# Patient Record
Sex: Male | Born: 1950 | Race: Black or African American | Hispanic: No | Marital: Single | Smoking: Current every day smoker
Health system: Southern US, Community
[De-identification: ages and names within clinical notes are randomized; demographics above are authoritative.]

## PROBLEM LIST (undated history)

## (undated) DIAGNOSIS — L309 Dermatitis, unspecified: Secondary | ICD-10-CM

## (undated) HISTORY — PX: NO PAST SURGERIES: SHX2092

---

## 2010-09-27 ENCOUNTER — Emergency Department (HOSPITAL_COMMUNITY): Payer: Self-pay

## 2010-09-27 ENCOUNTER — Emergency Department (HOSPITAL_COMMUNITY)
Admission: EM | Admit: 2010-09-27 | Discharge: 2010-09-27 | Disposition: A | Discharge status: Home / Self Care | Payer: Self-pay | Attending: Emergency Medicine | Admitting: Emergency Medicine

## 2010-09-27 DIAGNOSIS — M545 Low back pain, unspecified: Secondary | ICD-10-CM | POA: Insufficient documentation

## 2010-09-27 DIAGNOSIS — W11XXXA Fall on and from ladder, initial encounter: Secondary | ICD-10-CM | POA: Insufficient documentation

## 2010-09-27 DIAGNOSIS — S82899B Other fracture of unspecified lower leg, initial encounter for open fracture type I or II: Secondary | ICD-10-CM | POA: Insufficient documentation

## 2010-09-27 DIAGNOSIS — M542 Cervicalgia: Secondary | ICD-10-CM | POA: Insufficient documentation

## 2010-09-27 DIAGNOSIS — M79609 Pain in unspecified limb: Secondary | ICD-10-CM | POA: Insufficient documentation

## 2010-09-27 DIAGNOSIS — F17200 Nicotine dependence, unspecified, uncomplicated: Secondary | ICD-10-CM | POA: Insufficient documentation

## 2010-09-27 DIAGNOSIS — M47817 Spondylosis without myelopathy or radiculopathy, lumbosacral region: Secondary | ICD-10-CM | POA: Insufficient documentation

## 2012-07-12 ENCOUNTER — Encounter (HOSPITAL_COMMUNITY): Payer: Self-pay | Admitting: *Deleted

## 2012-07-12 ENCOUNTER — Emergency Department (HOSPITAL_COMMUNITY)
Admission: EM | Admit: 2012-07-12 | Discharge: 2012-07-12 | Disposition: A | Discharge status: Home / Self Care | Payer: Self-pay | Attending: Emergency Medicine | Admitting: Emergency Medicine

## 2012-07-12 DIAGNOSIS — L0291 Cutaneous abscess, unspecified: Secondary | ICD-10-CM

## 2012-07-12 DIAGNOSIS — L02818 Cutaneous abscess of other sites: Secondary | ICD-10-CM | POA: Insufficient documentation

## 2012-07-12 DIAGNOSIS — F17200 Nicotine dependence, unspecified, uncomplicated: Secondary | ICD-10-CM | POA: Insufficient documentation

## 2012-07-12 DIAGNOSIS — H01003 Unspecified blepharitis right eye, unspecified eyelid: Secondary | ICD-10-CM

## 2012-07-12 DIAGNOSIS — H01009 Unspecified blepharitis unspecified eye, unspecified eyelid: Secondary | ICD-10-CM | POA: Insufficient documentation

## 2012-07-12 DIAGNOSIS — L03818 Cellulitis of other sites: Secondary | ICD-10-CM | POA: Insufficient documentation

## 2012-07-12 MED ORDER — ERYTHROMYCIN 5 MG/GM OP OINT: TOPICAL_OINTMENT | OPHTHALMIC | Status: DC

## 2012-07-12 MED ORDER — CEPHALEXIN 500 MG PO CAPS: 500.0000 mg | ORAL_CAPSULE | Freq: Four times a day (QID) | ORAL | Status: DC

## 2012-07-12 MED ORDER — FAMOTIDINE 20 MG PO TABS: 20.0000 mg | ORAL_TABLET | Freq: Two times a day (BID) | ORAL | Status: DC

## 2012-07-12 MED ORDER — PERMETHRIN 5 % EX CREA: TOPICAL_CREAM | CUTANEOUS | Status: DC

## 2012-07-12 NOTE — ED Notes (Signed)
Pt c/o itching rash to head, shoulders, chest and back.  States he knows his apt has bed bugs and he has been stung by a bee.

## 2012-07-12 NOTE — ED Provider Notes (Signed)
History    This chart was scribed for Junious Silk PA-C, non-physician practitioner working with Glynn Octave, MD by Lewanda Rife, ED Scribe. This patient was seen in room TR03C/TR03C and the patient's care was started at 2208.     CSN: [id number]  Arrival date & time 07/12/12  2105   First MD Initiated Contact with Patient 07/12/12 2124      Chief Complaint  Patient presents with  . Rash    (Consider location/radiation/quality/duration/timing/severity/associated sxs/prior treatment) HPI Charles [name] is a 62 y.o. male who presents to the Emergency Department complaining of Having itchy skin for the past year. It has recently worsened. He states he has been scratching and has no marks on his skin from scratching. He has a lump on his head that has increased in size in the past few days. Yesterday he was able to get a white discharge to come out of it. He states that his apartment was infested with that bugs one year ago. He has moved since then. He does not see any bed bugs. He has not taken any medication for the itching. He also reports that his right eye became swollen and a couple days ago. It it does not hurt. This has never happened to him before. No vision changes such as blurry vision, double vision, floaters. He denies fever, chills, nausea, vomiting, abdominal pain, numbness, weakness, chest pain, shortness of breath.   No past medical history on file.  Past Surgical History  Procedure Laterality Date  . No past surgeries      No family history on file.  History  Substance Use Topics  . Smoking status: Current Every Day Smoker -- 1.00 packs/day    Types: Cigarettes  . Smokeless tobacco: Not on file  . Alcohol Use: Yes      Review of Systems A complete 10 system review of systems was obtained and all systems are negative except as noted in the HPI and PMH.    Allergies  Review of patient's allergies indicates no known allergies.  Home Medications    Current Outpatient Rx  Name  Route  Sig  Dispense  Refill  . ibuprofen (ADVIL,MOTRIN) 200 MG tablet   Oral   Take 200 mg by mouth every 6 (six) hours as needed for pain. For pain           BP 149/121  Pulse 95  Temp(Src) 98.5 F (36.9 C) (Oral)  Resp 16  SpO2 97%  Physical Exam  Nursing note and vitals reviewed. Constitutional: He is oriented to person, place, and time. He appears well-developed and well-nourished. No distress.  HENT:  Head: Normocephalic and atraumatic.  Right Ear: External ear normal.  Left Ear: External ear normal.  Nose: Nose normal.  Eyes: Conjunctivae and EOM are normal.  Mild swelling surrounding right eye  Neck: Normal range of motion. No tracheal deviation present.  Cardiovascular: Normal rate, regular rhythm and normal heart sounds.   Pulmonary/Chest: Effort normal and breath sounds normal. No stridor.  Abdominal: Soft. He exhibits no distension. There is no tenderness.  Musculoskeletal: Normal range of motion.  Neurological: He is alert and oriented to person, place, and time.  Skin: Skin is warm and dry. He is not diaphoretic. No erythema.  Excoriations present on back, torso, arms Large lump on for head with scab on top - no erythema, induration. The lump is fluctuant  Psychiatric: He has a normal mood and affect. His behavior is normal.    ED Course  Procedures (including critical care time) Medications - No data to display  Labs Reviewed - No data to display No results found.   1. Abscess   2. Blepharitis of right eye       MDM  Patient presents today after itching his entire body for one year. He is covered with excoriations. There is a large abscess on the top of his head that was drained without complication. He is afebrile. Covered with Keflex. He has history of bed bugs in his apartment. He was given Elimite cream for this. He also complains of swelling of his right eye. He was given erythromycin cream for possible  blepharitis. He was given Pepcid for the itching. Case was discussed with Dr. Manus Gunning. He agrees with plan. Return instructions given. Vital signs stable for discharge. Patient / Family / Caregiver informed of clinical course, understand medical decision-making process, and agree with plan.      I personally performed the services described in this documentation, which was scribed in my presence. The recorded information has been reviewed and is accurate.   Mora Bellman, PA-C 07/13/12 2256

## 2012-07-13 NOTE — ED Provider Notes (Signed)
Medical screening examination/treatment/procedure(s) were conducted as a shared visit with non-physician practitioner(s) and myself.  I personally evaluated the patient during the encounter  Scalp abscess with excoriations  Glynn Octave, MD 07/13/12 [phone]

## 2012-07-15 NOTE — ED Notes (Signed)
Called in a rx of Keflex 500mg  to [facility] at [phone]

## 2012-11-24 ENCOUNTER — Encounter (HOSPITAL_COMMUNITY): Payer: Self-pay

## 2012-11-24 ENCOUNTER — Emergency Department (HOSPITAL_COMMUNITY)
Admission: EM | Admit: 2012-11-24 | Discharge: 2012-11-24 | Disposition: A | Discharge status: Home / Self Care | Payer: Self-pay | Attending: Emergency Medicine | Admitting: Emergency Medicine

## 2012-11-24 DIAGNOSIS — L299 Pruritus, unspecified: Secondary | ICD-10-CM | POA: Insufficient documentation

## 2012-11-24 DIAGNOSIS — F17200 Nicotine dependence, unspecified, uncomplicated: Secondary | ICD-10-CM | POA: Insufficient documentation

## 2012-11-24 DIAGNOSIS — T148XXA Other injury of unspecified body region, initial encounter: Secondary | ICD-10-CM

## 2012-11-24 DIAGNOSIS — L981 Factitial dermatitis: Secondary | ICD-10-CM | POA: Insufficient documentation

## 2012-11-24 MED ORDER — HYDROXYZINE HCL 25 MG PO TABS: 25.0000 mg | ORAL_TABLET | Freq: Three times a day (TID) | ORAL | Status: DC | PRN

## 2012-11-24 MED ORDER — MUPIROCIN CALCIUM 2 % EX CREA: TOPICAL_CREAM | Freq: Three times a day (TID) | CUTANEOUS | Status: DC

## 2012-11-24 MED ORDER — HYDROXYZINE HCL 25 MG PO TABS
25.0000 mg | ORAL_TABLET | Freq: Once | ORAL | Status: AC
  Administered 2012-11-24: 25 mg via ORAL
  Filled 2012-11-24: qty 1

## 2012-11-24 MED ORDER — HYDROCORTISONE 1 % EX CREA: TOPICAL_CREAM | Freq: Two times a day (BID) | CUTANEOUS | Status: DC

## 2012-11-24 NOTE — ED Notes (Signed)
Pt from home reports rash x6 months. Pt has red, scaly, rash with sores all over body. Pt states that he went to [facility] and was given abx that did not help. Pt A&O and in NAD

## 2012-11-24 NOTE — ED Notes (Signed)
Pt has had sores to body for a few months .

## 2012-11-24 NOTE — ED Provider Notes (Signed)
CSN: [id number]     Arrival date & time 11/24/12  4098 History   First MD Initiated Contact with Patient 11/24/12 1008     Chief Complaint  Patient presents with  . Rash   (Consider location/radiation/quality/duration/timing/severity/associated sxs/prior Treatment) HPI Comments: This is a 62 year old male who presents today with a itchy rash on his back for the past 5-6 months. He states it is gradually worsening. It is only itchy when he is in the heat. He has tried an ointment with no relief. There was a patch on his left leg that resolved. No one else has this rash. No change in symptom detergents. No new exposures. He denies any other symptoms including fever, chills, arthralgias, myalgias, numbness, weakness, nausea, vomiting or abdominal pain.  The history is provided by the patient. No language interpreter was used.    History reviewed. No pertinent past medical history. Past Surgical History  Procedure Laterality Date  . No past surgeries     History reviewed. No pertinent family history. History  Substance Use Topics  . Smoking status: Current Every Day Smoker -- 1.00 packs/day    Types: Cigarettes  . Smokeless tobacco: Not on file  . Alcohol Use: Yes    Review of Systems  Constitutional: Negative for fever and chills.  Respiratory: Negative for shortness of breath.   Gastrointestinal: Negative for nausea, vomiting and abdominal pain.  Musculoskeletal: Negative for myalgias, arthralgias and gait problem.  Skin: Positive for rash.  All other systems reviewed and are negative.    Allergies  Review of patient's allergies indicates no known allergies.  Home Medications   Current Outpatient Rx  Name  Route  Sig  Dispense  Refill  . naproxen sodium (ANAPROX) 220 MG tablet   Oral   Take 220 mg by mouth 2 (two) times daily as needed.          BP 149/100  Pulse 79  Temp(Src) 97.7 F (36.5 C) (Oral)  Resp 16  SpO2 98% Physical Exam  Nursing note and vitals  reviewed. Constitutional: He is oriented to person, place, and time. He appears well-developed and well-nourished. No distress.  HENT:  Head: Normocephalic and atraumatic.  Right Ear: External ear normal.  Left Ear: External ear normal.  Nose: Nose normal.  Eyes: Conjunctivae are normal.  Neck: Normal range of motion. No tracheal deviation present.  Cardiovascular: Normal rate, regular rhythm and normal heart sounds.   Pulmonary/Chest: Effort normal and breath sounds normal. No stridor.  Abdominal: Soft. He exhibits no distension. There is no tenderness.  Musculoskeletal: Normal range of motion.  Neurological: He is alert and oriented to person, place, and time.  Skin: Skin is warm and dry. He is not diaphoretic.  Several large excoriations on the back. Excoriation on left lower leg. No bullae, vesicles, erythema, pustules.  Psychiatric: He has a normal mood and affect. His behavior is normal.    ED Course  Procedures (including critical care time) Labs Review Labs Reviewed - No data to display Imaging Review No results found.  MDM   1. Itchy skin   2. Excoriation    Patient with pruritic areas on his body for the past 6 months. He is being given Elimite cream with no relief. Will DC with hydrocortisone cream, Bactroban, and Atarax. Will give dermatology followup. Return instructions given. Vital signs stable for discharge. Patient / Family / Caregiver informed of clinical course, understand medical decision-making process, and agree with plan.     Mora Bellman,  PA-C 11/24/12 1031

## 2012-11-27 NOTE — ED Provider Notes (Signed)
Medical screening examination/treatment/procedure(s) were performed by non-physician practitioner and as supervising physician I was immediately available for consultation/collaboration.  My Rinke R. Trell Secrist, MD 11/27/12 1532 

## 2013-03-28 ENCOUNTER — Encounter (HOSPITAL_COMMUNITY): Payer: Self-pay | Admitting: Emergency Medicine

## 2013-03-28 ENCOUNTER — Emergency Department (HOSPITAL_COMMUNITY)
Admission: EM | Admit: 2013-03-28 | Discharge: 2013-03-28 | Disposition: A | Discharge status: Home / Self Care | Payer: Medicare Other | Attending: Emergency Medicine | Admitting: Emergency Medicine

## 2013-03-28 DIAGNOSIS — L02219 Cutaneous abscess of trunk, unspecified: Secondary | ICD-10-CM | POA: Insufficient documentation

## 2013-03-28 DIAGNOSIS — L03319 Cellulitis of trunk, unspecified: Secondary | ICD-10-CM

## 2013-03-28 DIAGNOSIS — Z792 Long term (current) use of antibiotics: Secondary | ICD-10-CM | POA: Insufficient documentation

## 2013-03-28 DIAGNOSIS — F17200 Nicotine dependence, unspecified, uncomplicated: Secondary | ICD-10-CM | POA: Insufficient documentation

## 2013-03-28 DIAGNOSIS — L0291 Cutaneous abscess, unspecified: Secondary | ICD-10-CM

## 2013-03-28 DIAGNOSIS — L309 Dermatitis, unspecified: Secondary | ICD-10-CM

## 2013-03-28 DIAGNOSIS — L259 Unspecified contact dermatitis, unspecified cause: Secondary | ICD-10-CM | POA: Insufficient documentation

## 2013-03-28 MED ORDER — PREDNISONE 20 MG PO TABS: ORAL_TABLET | ORAL | Status: DC

## 2013-03-28 MED ORDER — CEPHALEXIN 250 MG PO CAPS: 250.0000 mg | ORAL_CAPSULE | Freq: Four times a day (QID) | ORAL | Status: DC

## 2013-03-28 MED ORDER — HYDROXYZINE HCL 25 MG PO TABS: 12.5000 mg | ORAL_TABLET | Freq: Three times a day (TID) | ORAL | Status: DC | PRN

## 2013-03-28 NOTE — ED Notes (Signed)
Per pt, has had rash for 6 months-abscess on left chest

## 2013-03-28 NOTE — ED Provider Notes (Signed)
CSN: [id number]     Arrival date & time 03/28/13  0945 History   First MD Initiated Contact with Patient 03/28/13 1003     Chief Complaint  Patient presents with  . Rash  . Abscess   (Consider location/radiation/quality/duration/timing/severity/associated sxs/prior Treatment) The history is provided by the patient. No language interpreter was used.  Charles Harborugene [name] is a 63 year-old male with no significant past medical history presenting to the emergency department with a rash that has been ongoing for the past 6-7 months. As per wife, reported that they have been living in the [facility] for the past 2 years, recently moved out in June of 2014. Wife reports that this is when the rash started. Patient reports that the rash has gotten progressively worse. Reports that it is now all over his body, constant pruritis without relief. Patient reports he has used over-the-counter topicals, by mouth medications, medicated topicals without relief. Patient denies being seen by a specialist. Reports that there is a burning sensation after itching lesions. Reports that the lesions tend to bleed after itching. Denied pus drainage. Reports he's noticed an abscess forming to the left side of his chest approximately 2-3 days ago, denied pain or growth. Reported that he has been trying to pop the abscess, reported drainage of pus and blood. Denied fever, chills, neck pain, neck stiffness, chest pain, shortness of breath, difficulty breathing.   PCP none  History reviewed. No pertinent past medical history. Past Surgical History  Procedure Laterality Date  . No past surgeries     No family history on file. History  Substance Use Topics  . Smoking status: Current Every Day Smoker -- 1.00 packs/day    Types: Cigarettes  . Smokeless tobacco: Not on file  . Alcohol Use: Yes    Review of Systems  Constitutional: Negative for fever and chills.  Musculoskeletal: Negative for neck pain.  Skin: Positive for  rash and wound (abscess).  All other systems reviewed and are negative.    Allergies  Review of patient's allergies indicates no known allergies.  Home Medications   Current Outpatient Rx  Name  Route  Sig  Dispense  Refill  . mupirocin cream (BACTROBAN) 2 %   Topical   Apply topically 3 (three) times daily.   15 g   0   . naproxen sodium (ANAPROX) 220 MG tablet   Oral   Take 440 mg by mouth 2 (two) times daily as needed.          . cephALEXin (KEFLEX) 250 MG capsule   Oral   Take 1 capsule (250 mg total) by mouth 4 (four) times daily.   28 capsule   0   . hydrOXYzine (ATARAX/VISTARIL) 25 MG tablet   Oral   Take 0.5 tablets (12.5 mg total) by mouth every 8 (eight) hours as needed for itching.   12 tablet   0   . predniSONE (DELTASONE) 20 MG tablet      3 tabs po day one, then 2 tabs daily x 4 days   11 tablet   0    BP 136/97  Pulse 89  Temp(Src) 98 F (36.7 C) (Oral)  Resp 16  SpO2 100% Physical Exam  Nursing note and vitals reviewed. Constitutional: He is oriented to person, place, and time. He appears well-developed and well-nourished. No distress.  HENT:  Head: Normocephalic and atraumatic.  Eyes: Conjunctivae and EOM are normal. Pupils are equal, round, and reactive to light. Right eye exhibits  no discharge. Left eye exhibits no discharge.  Neck: Normal range of motion. Neck supple.  Negative cervical lymphadenopathy  Cardiovascular: Normal rate, regular rhythm and normal heart sounds.  Exam reveals no friction rub.   No murmur heard. Pulmonary/Chest: Effort normal and breath sounds normal. No respiratory distress. He has no wheezes. He has no rales.  Musculoskeletal: Normal range of motion.  Full ROM to upper and lower extremities without difficulty noted, negative ataxia noted.  Lymphadenopathy:    He has no cervical adenopathy.  Neurological: He is alert and oriented to person, place, and time. He exhibits normal muscle tone. Coordination  normal.  Skin: Skin is warm and dry. Rash noted. He is not diaphoretic.  Widespread rash noted to the anterior and posterior aspect of the body. Not affecting the face or feet. Scattered patches of escortion skin. Areas are scabbed over. Lesions with exposed healing scabs. Approximately 2 cm x 2 cm abscess identified. Indurated. Positive scab noted to the abscess. Negative findings for spreading infection. Negative findings of cellulitis. Negative active drainage or bleeding noted. Negative pain upon palpation. Negative fluctuance the  Psychiatric: He has a normal mood and affect. His behavior is normal. Thought content normal.    ED Course  Procedures (including critical care time)  10:46 AM This provider spoke with Dr. Marylen Ponto regarding recommendations for this patient. Recommended steroids PO and specialist follow up.   INCISION AND DRAINAGE Performed by: Raymon Mutton Consent: Verbal consent obtained. Risks and benefits: risks, benefits and alternatives were discussed Type: abscess Body area: Left-sided chest Anesthesia: local infiltration Incision was made with a scalpel. Local anesthetic: lidocaine 2 % without epinephrine Anesthetic total: 3 ml Complexity: complex Blunt dissection to break up loculations Drainage: purulent and bloody  Drainage amount: 5cc Patient tolerance: Patient tolerated the procedure well with no immediate complications.    Labs Review Labs Reviewed - No data to display Imaging Review No results found.  EKG Interpretation   None       MDM   1. Dermatitis   2. Eczema   3. Abscess     Filed Vitals:   03/28/13 1058  BP: 136/97  Pulse: 89  Temp: 98 F (36.7 C)  TempSrc: Oral  Resp: 16  SpO2: 100%    Patient presenting to emergency department with rash that has been ongoing for the past 6-7 months. Patient has been placed on erythromycin, muciporin cream, hydrocortisone cream, Pepcid, antibiotics, steroids, elimite cream with  negative relief. Patient has been seen in the emergency department regarding this same complaint-reports that the rash has gotten progressively worse and has now spread to the entire body. Reports constant pruritis.  Alert and oriented. GCS 15. Heart rate and rhythm normal. Pulses palpable and strong, radial and DP 2+. Lungs clear to auscultation bilaterally. Scattered patches of escortion of skin with healing lesions, scabs identified to the anterior and posterior aspect of the body-not affecting the feet or face. Abscess localized left side of the chest-measuring approximately 2 cm x 2 cm, indurated, scab noted, negative active drainage or bleeding noted. Negative pain upon palpation. Abscess drained without complications - drainage of blood and pus identified. Patient tolerated procedure well. Suspicion to be possible eczema or dermatitis. Abscess drained. Discussed with patient proper cleaning of the abscess region. Patient stable, afebrile. Discharged patient. Discharge patient with antibiotics, prednisone, Atarax. Referred patient to dermatologist to be reassessed regarding this rash. Discussed importance of following up with dermatologist regarding this continuous issue. Discussed with patient to  closely monitor symptoms and if symptoms are to worsen or change to report back to the ED - strict return instructions given.  Patient agreed to plan of care, understood, all questions answered.      Raymon Mutton, PA-C 03/29/13 [phone]

## 2013-03-28 NOTE — Discharge Instructions (Signed)
Please call and set-up an appointment with Urgent Care Please call and set-up an appointment with Dermatologist regarding rash that has been ongoing for the past 6 months Please take medications as prescribed Please rest and stay hydrated  Please wash area of the abscess a least 3 times per day with warm water and soap, Pat dry, apply clean gauze to the site, keep covered at all times. If the bandages become wet please change to dry once.  Please continue to monitor abscess-if the area becomes to red, hot to the touch, painful, swelling enlarged in this please report back to emergency department. Please continue to monitor symptoms closely and if symptoms are to worsen or change (fever greater than 101, chills, neck pain, neck stiffness, worsening rash, drainage from the rash, swelling to the skin, throat closing sensation, worsening symptoms) is report back to emergency department immediately   Abscess Care After An abscess (also called a boil or furuncle) is an infected area that contains a collection of pus. Signs and symptoms of an abscess include pain, tenderness, redness, or hardness, or you may feel a moveable soft area under your skin. An abscess can occur anywhere in the body. The infection may spread to surrounding tissues causing cellulitis. A cut (incision) by the surgeon was made over your abscess and the pus was drained out. Gauze may have been packed into the space to provide a drain that will allow the cavity to heal from the inside outwards. The boil may be painful for 5 to 7 days. Most people with a boil do not have high fevers. Your abscess, if seen early, may not have localized, and may not have been lanced. If not, another appointment may be required for this if it does not get better on its own or with medications. HOME CARE INSTRUCTIONS   Only take over-the-counter or prescription medicines for pain, discomfort, or fever as directed by your caregiver.  When you bathe, soak and  then remove gauze or iodoform packs at least daily or as directed by your caregiver. You may then wash the wound gently with mild soapy water. Repack with gauze or do as your caregiver directs. SEEK IMMEDIATE MEDICAL CARE IF:   You develop increased pain, swelling, redness, drainage, or bleeding in the wound site.  You develop signs of generalized infection including muscle aches, chills, fever, or a general ill feeling.  An oral temperature above 102 F (38.9 C) develops, not controlled by medication. See your caregiver for a recheck if you develop any of the symptoms described above. If medications (antibiotics) were prescribed, take them as directed. Document Released: 09/22/2004 Document Revised: 05/29/2011 Document Reviewed: 05/20/2007 [facility] Patient Information 2014 [place]. Contact Dermatitis Contact dermatitis is a reaction to certain substances that touch the skin. Contact dermatitis can be either irritant contact dermatitis or allergic contact dermatitis. Irritant contact dermatitis does not require previous exposure to the substance for a reaction to occur.Allergic contact dermatitis only occurs if you have been exposed to the substance before. Upon a repeat exposure, your body reacts to the substance.  CAUSES  Many substances can cause contact dermatitis. Irritant dermatitis is most commonly caused by repeated exposure to mildly irritating substances, such as:  Makeup.  Soaps.  Detergents.  Bleaches.  Acids.  Metal salts, such as nickel. Allergic contact dermatitis is most commonly caused by exposure to:  Poisonous plants.  Chemicals (deodorants, shampoos).  Jewelry.  Latex.  Neomycin in triple antibiotic cream.  Preservatives in products, including clothing.  SYMPTOMS  The area of skin that is exposed may develop:  Dryness or flaking.  Redness.  Cracks.  Itching.  Pain or a burning sensation.  Blisters. With allergic contact dermatitis,  there may also be swelling in areas such as the eyelids, mouth, or genitals.  DIAGNOSIS  Your caregiver can usually tell what the problem is by doing a physical exam. In cases where the cause is uncertain and an allergic contact dermatitis is suspected, a patch skin test may be performed to help determine the cause of your dermatitis. TREATMENT Treatment includes protecting the skin from further contact with the irritating substance by avoiding that substance if possible. Barrier creams, powders, and gloves may be helpful. Your caregiver may also recommend:  Steroid creams or ointments applied 2 times daily. For best results, soak the rash area in cool water for 20 minutes. Then apply the medicine. Cover the area with a plastic wrap. You can store the steroid cream in the refrigerator for a "chilly" effect on your rash. That may decrease itching. Oral steroid medicines may be needed in more severe cases.  Antibiotics or antibacterial ointments if a skin infection is present.  Antihistamine lotion or an antihistamine taken by mouth to ease itching.  Lubricants to keep moisture in your skin.  Burow's solution to reduce redness and soreness or to dry a weeping rash. Mix one packet or tablet of solution in 2 cups cool water. Dip a clean washcloth in the mixture, wring it out a bit, and put it on the affected area. Leave the cloth in place for 30 minutes. Do this as often as possible throughout the day.  Taking several cornstarch or baking soda baths daily if the area is too large to cover with a washcloth. Harsh chemicals, such as alkalis or acids, can cause skin damage that is like a burn. You should flush your skin for 15 to 20 minutes with cold water after such an exposure. You should also seek immediate medical care after exposure. Bandages (dressings), antibiotics, and pain medicine may be needed for severely irritated skin.  HOME CARE INSTRUCTIONS  Avoid the substance that caused your  reaction.  Keep the area of skin that is affected away from hot water, soap, sunlight, chemicals, acidic substances, or anything else that would irritate your skin.  Do not scratch the rash. Scratching may cause the rash to become infected.  You may take cool baths to help stop the itching.  Only take over-the-counter or prescription medicines as directed by your caregiver.  See your caregiver for follow-up care as directed to make sure your skin is healing properly. SEEK MEDICAL CARE IF:   Your condition is not better after 3 days of treatment.  You seem to be getting worse.  You see signs of infection such as swelling, tenderness, redness, soreness, or warmth in the affected area.  You have any problems related to your medicines. Document Released: 03/03/2000 Document Revised: 05/29/2011 Document Reviewed: 08/09/2010 [facility] Patient Information 2014 [place], [place]. Eczema Atopic dermatitis, or eczema, is an inherited type of sensitive skin. Often people with eczema have a family history of allergies, asthma, or hay fever. It causes a red itchy rash and dry scaly skin. The itchiness may occur before the skin rash and may be very intense. It is not contagious. Eczema is generally worse during the cooler winter months and often improves with the warmth of summer. Eczema usually starts showing signs in infancy. Some children outgrow eczema, but it may last  through adulthood. Flare-ups may be caused by:  Eating something or contact with something you are sensitive or allergic to.  Stress. DIAGNOSIS  The diagnosis of eczema is usually based upon symptoms and medical history. TREATMENT  Eczema cannot be cured, but symptoms usually can be controlled with treatment or avoidance of allergens (things to which you are sensitive or allergic to).  Controlling the itching and scratching.  Use over-the-counter antihistamines as directed for itching. It is especially useful at night when the  itching tends to be worse.  Use over-the-counter steroid creams as directed for itching.  Scratching makes the rash and itching worse and may cause impetigo (a skin infection) if fingernails are contaminated (dirty).  Keeping the skin well moisturized with creams every day. This will seal in moisture and help prevent dryness. Lotions containing alcohol and water can dry the skin and are not recommended.  Limiting exposure to allergens.  Recognizing situations that cause stress.  Developing a plan to manage stress. HOME CARE INSTRUCTIONS   Take prescription and over-the-counter medicines as directed by your caregiver.  Do not use anything on the skin without checking with your caregiver.  Keep baths or showers short (5 minutes) in warm (not hot) water. Use mild cleansers for bathing. You may add non-perfumed bath oil to the bath water. It is best to avoid soap and bubble bath.  Immediately after a bath or shower, when the skin is still damp, apply a moisturizing ointment to the entire body. This ointment should be a petroleum ointment. This will seal in moisture and help prevent dryness. The thicker the ointment the better. These should be unscented.  Keep fingernails cut short and wash hands often. If your child has eczema, it may be necessary to put soft gloves or mittens on your child at night.  Dress in clothes made of cotton or cotton blends. Dress lightly, as heat increases itching.  Avoid foods that may cause flare-ups. Common foods include cow's milk, peanut butter, eggs and wheat.  Keep a child with eczema away from anyone with fever blisters. The virus that causes fever blisters (herpes simplex) can cause a serious skin infection in children with eczema. SEEK MEDICAL CARE IF:   Itching interferes with sleep.  The rash gets worse or is not better within one week following treatment.  The rash looks infected (pus or soft yellow scabs).  You or your child has an oral  temperature above 102 F (38.9 C).  Your baby is older than 3 months with a rectal temperature of 100.5 F (38.1 C) or higher for more than 1 day.  The rash flares up after contact with someone who has fever blisters. SEEK IMMEDIATE MEDICAL CARE IF:   Your baby is older than 3 months with a rectal temperature of 102 F (38.9 C) or higher.  Your baby is older than 3 months or younger with a rectal temperature of 100.4 F (38 C) or higher. Document Released: 03/03/2000 Document Revised: 05/29/2011 Document Reviewed: 10/07/2012 [facility] Patient Information 2014 [place], [place].   Emergency Department Resource Guide 1) Find a Doctor and Pay Out of Pocket Although you won't have to find out who is covered by your insurance plan, it is a good idea to ask around and get recommendations. You will then need to call the office and see if the doctor you have chosen will accept you as a new patient and what types of options they offer for patients who are self-pay. Some doctors offer discounts  or will set up payment plans for their patients who do not have insurance, but you will need to ask so you aren't surprised when you get to your appointment.  2) Contact Your Local Health Department Not all health departments have doctors that can see patients for sick visits, but many do, so it is worth a call to see if yours does. If you don't know where your local health department is, you can check in your phone book. The CDC also has a tool to help you locate your state's health department, and many state websites also have listings of all of their local health departments.  3) Find a Walk-in Clinic If your illness is not likely to be very severe or complicated, you may want to try a walk in clinic. These are popping up all over the country in pharmacies, drugstores, and shopping centers. They're usually staffed by nurse practitioners or physician assistants that have been trained to treat common illnesses  and complaints. They're usually fairly quick and inexpensive. However, if you have serious medical issues or chronic medical problems, these are probably not your best option.  No Primary Care Doctor: - Call Health Connect at  [phone] - they can help you locate a primary care doctor that  accepts your insurance, provides certain services, etc. - Physician Referral Service- [phone]  Chronic Pain Problems: Organization         Address  Phone   Notes  [facility]  [phone] Patients need to be referred by their primary care doctor.   Medication Assistance: Organization         Address  Phone   Notes  [facility] Medication [facility] [address] [place], [place] [address] [phone] --Must be a resident of [facility] -- Must have NO insurance coverage whatsoever (no Medicaid/ Medicare, etc.) -- The pt. MUST have a primary care doctor that directs their care regularly and follows them in the community   [facility]ist  [phone]   [employer]  [phone]    Agencies that provide inexpensive medical care: Organization         Address  Phone   Notes  [facility]  [phone]   [facility]    [phone]   [facility] [address] [place], [place] [address] [phone]   [facility] [address] [place], [place] [phone]   Planned Parenthood    [phone]   [facility]    [phone]   [facility]  [address], [place] Phone:  [phone], Fax:  [phone] Hours of Operation:  9 am - 6 pm, M-F.  Also accepts Medicaid/Medicare and self-pay.  [facility]  [address], [place] Phone: [phone], Fax: [phone]. Hours of Operation:  8:30 am - 5:30 pm, M-F.  Also accepts Medicaid and self-pay.  [facility] [place] [facility] [place] [address] [place] Phone: [phone]   [facility] [address] Natasha Bence [place], [place] [phone], Ext. [phone] Mondays & Thursdays: 7-9 AM.  First 15 patients are seen on a first come, first serve basis.    Medicaid-accepting [facility] Providers:  Organization         Address  Phone   Notes  [facility] [address], Ervin Knack, [place] (  [phone] Also accepts self-pay patients.  [facility] [address] Laurell Josephs [place], [place]  [phone]   [facility] [address], [place] [phone]   [facility] [address] [place] [phone]   Renaye Rakers [address], [place]   [phone] Only accepts [place] Access [place] patients after they have their name applied to their card.   Self-Pay (no insurance) in [facility]:  Organization         Address  Phone   Notes  Sickle Cell Patients, [facility] [address] [place], [place] [phone]   [facility] [address] [place], [place] [phone]   [facility] [place]  [address], [place] [phone]   [facility]/Dr. Osei-Bonsu  [address], [place] or [address], [place] [phone] Phone number for both [place] and [place] locations is the same.  Urgent Medical and [facility] [address], [place] [phone]   [facility] [address], [place] or [address] [phone]   [facility] [address], [place] [phone], phone; [phone], fax Sees patients 1st and 3rd Saturday of every month.  Must not qualify for public or private insurance (i.e. Medicaid, Medicare, [place] Health Choice, Veterans' Benefits)  Household income should be no more than 200% of the poverty level  The clinic cannot treat you if you are pregnant or think you are pregnant  Sexually transmitted diseases are not treated at the clinic.    Dental Care: Organization         Address  Phone  Notes  [facility] [address] [place], [place] [phone] Accepts children up to age 70 who are enrolled in [place] or [place] Health Choice; pregnant women with a Medicaid card; and children who have applied for Medicaid or [place] Health Choice, but were declined, whose parents can pay a reduced fee at time of service.  [facility] of [facility]  [address], [place] [phone] Accepts children up to age 70 who are enrolled in [place] or [place] Health Choice; pregnant women with a Medicaid card; and children who have applied for Medicaid or [place] Health Choice, but were declined, whose parents can pay a reduced fee at time of service.  [facility]  [address] [place], [place] [phone] Patients are seen by appointment only. Walk-ins are not accepted. [facility] will see patients 76 years of age and older. Monday - Tuesday (8am-5pm) Most Wednesdays (8:30-5pm) $30 per visit, cash only  [facility]  [address], [facility] [phone] Patients are seen by appointment only. Walk-ins are not accepted. [facility] will see patients 61 years of age and older. One Wednesday Evening (Monthly: Volunteer Based).  $30 per visit, cash only  [employer]  [phone] for adults; Children under age 70, call Graduate Pediatric Dentistry at [phone]. Children aged 11-14, please call [phone] to request a pediatric application.  Dental services are provided in all areas of dental care including fillings, crowns and bridges, complete and partial dentures, implants, gum treatment, root canals, and extractions. Preventive care is  also  provided. Treatment is provided to both adults and children. Patients are selected via a lottery and there is often a waiting list.   [facility] [address] [place]  [phone] [email or web address]   [facility] [address] [place], [place] [phone], Ext. [phone] Second and Fourth Thursday of each month, opens at 6:30 AM; Clinic ends at 9 AM.  Patients are seen on a first-come first-served basis, and a limited number are seen during each clinic.   [facility]  [address] Ether Griffins [place], [place] [phone]   Eligibility Requirements You must have lived in [place], [place], or [place] counties for at least the last three months.   You cannot be eligible for state or federal sponsored [place], including [employer], [place], or [employer].   You generally cannot be eligible for healthcare insurance through your employer.    How to apply: Eligibility screenings are held every Tuesday and Wednesday afternoon from 1:00 pm until 4:00 pm. You do not need an appointment for the interview!  [facility] [address] [place], [place] [phone]   [facility]  [phone]   [facility] Health [facility]  [phone]   [facility] Health Department  [phone]    Behavioral Health Resources in the Community: Intensive Outpatient Programs Organization         Address  Phone  Notes  [facility] [address], [place], [place] [phone]   [facility] Outpatient [address] [place], [place] [phone]   [facility] [address] [place], [place]  [phone]   [facility] [address]  [place], [place] [phone] or [phone]   Substance Abuse Resources Organization         Address  Phone  Notes  [facility] Drug Services  [phone]   [facility]  [phone]   [facility]  [phone]   Floydene Flock  [phone]   Residential & Outpatient Substance Abuse Program  [phone]   Psychological Services Organization         Address  Phone  Notes  [facility] Behavioral Health  [phone]   [facility]  [phone]   [facility] [address] [place] [phone] or [phone]    Mobile Crisis Teams Organization         Address  Phone  Notes  [facility], Mobile Crisis Care Unit  [phone]   [facility]  [address] [place], [place] [phone]   Doristine Locks [address] [place] [phone]    Self-Help/Support Groups Organization         Address  Phone             Notes  [facility] [place] - variety of support groups  [phone]- I7437963 Call for more information  Narcotics Anonymous (NA), [facility] [address], [employer] [place]  2 meetings at this location   Statistician         Address  Phone  Notes  [facility] [address],    [place]  [phone]   [facility]  [address] [place] [address], [place], [place] [phone]   [facility] [address] [place], [place] [phone] Admissions: 8am-3pm M-F  [facility] [address]    [place], [place] [phone]   [facility] [address] Starling Manns [place], [place] [phone]   [facility] [address]  [place], [place] [phone]   [facility] - Intensive Outpatient [address] Laurell Josephs [address], [place], [place] [phone]   [facility] ([facility] [address] [place]  [place], [place] [phone] or [phone]   [facility] ([facility]) [address], [place], [place] [phone] Accepts Medicaid  [facility] [address],  [place] [phone]  Substance Abuse/Addiction Treatment   [facility] Organization         Address  Phone  Notes  [facility]  [phone]   Angie Fava, PhD [address] Ervin Knack [place], [place]   [phone] or [phone]   [facility] Behavioral   [address] [place], [place] [phone]   [facility] [address], [place], [place] [phone] Insurance/Medicaid/sponsorship through [facility] [address]                                    [place], [place] [phone] Therapy/tele-psych/case  [facility] [address], [place] [phone]    Dr. Lolly Mustache  [phone]   [facility]  [employer] [facility] Dept. 1) [address], [place] 2) [address] [place] 3)  [address], [place] [phone]  [phone]   [facility] Child Abuse Hotline 404-516-2884 or [phone] (After Hours)

## 2013-03-31 NOTE — ED Provider Notes (Signed)
Medical screening examination/treatment/procedure(s) were performed by non-physician practitioner and as supervising physician I was immediately available for consultation/collaboration.  EKG Interpretation   None        Ahmir Bracken, MD 03/31/13 1502 

## 2014-08-31 ENCOUNTER — Emergency Department (HOSPITAL_COMMUNITY)
Admission: EM | Admit: 2014-08-31 | Discharge: 2014-08-31 | Disposition: A | Discharge status: Home / Self Care | Payer: Medicare Other | Attending: Emergency Medicine | Admitting: Emergency Medicine

## 2014-08-31 ENCOUNTER — Encounter (HOSPITAL_COMMUNITY): Payer: Self-pay | Admitting: Emergency Medicine

## 2014-08-31 DIAGNOSIS — L309 Dermatitis, unspecified: Secondary | ICD-10-CM | POA: Diagnosis not present

## 2014-08-31 DIAGNOSIS — Z720 Tobacco use: Secondary | ICD-10-CM | POA: Insufficient documentation

## 2014-08-31 DIAGNOSIS — Z792 Long term (current) use of antibiotics: Secondary | ICD-10-CM | POA: Diagnosis not present

## 2014-08-31 DIAGNOSIS — R21 Rash and other nonspecific skin eruption: Secondary | ICD-10-CM | POA: Diagnosis present

## 2014-08-31 MED ORDER — PREDNISONE 10 MG PO TABS: ORAL_TABLET | ORAL | Status: DC

## 2014-08-31 NOTE — Discharge Instructions (Signed)
Eczema Eczema, also called atopic dermatitis, is a skin disorder that causes inflammation of the skin. It causes a red rash and dry, scaly skin. The skin becomes very itchy. Eczema is generally worse during the cooler winter months and often improves with the warmth of summer. Eczema usually starts showing signs in infancy. Some children outgrow eczema, but it may last through adulthood.  CAUSES  The exact cause of eczema is not known, but it appears to run in families. People with eczema often have a family history of eczema, allergies, asthma, or hay fever. Eczema is not contagious. Flare-ups of the condition may be caused by:   Contact with something you are sensitive or allergic to.   Stress. SIGNS AND SYMPTOMS  Dry, scaly skin.   Red, itchy rash.   Itchiness. This may occur before the skin rash and may be very intense.  DIAGNOSIS  The diagnosis of eczema is usually made based on symptoms and medical history. TREATMENT  Eczema cannot be cured, but symptoms usually can be controlled with treatment and other strategies. A treatment plan might include:  Controlling the itching and scratching.   Use over-the-counter antihistamines as directed for itching. This is especially useful at night when the itching tends to be worse.   Use over-the-counter steroid creams as directed for itching.   Avoid scratching. Scratching makes the rash and itching worse. It may also result in a skin infection (impetigo) due to a break in the skin caused by scratching.   Keeping the skin well moisturized with creams every day. This will seal in moisture and help prevent dryness. Lotions that contain alcohol and water should be avoided because they can dry the skin.   Limiting exposure to things that you are sensitive or allergic to (allergens).   Recognizing situations that cause stress.   Developing a plan to manage stress.  HOME CARE INSTRUCTIONS   Only take over-the-counter or  prescription medicines as directed by your health care provider.   Do not use anything on the skin without checking with your health care provider.   Keep baths or showers short (5 minutes) in warm (not hot) water. Use mild cleansers for bathing. These should be unscented. You may add nonperfumed bath oil to the bath water. It is best to avoid soap and bubble bath.   Immediately after a bath or shower, when the skin is still damp, apply a moisturizing ointment to the entire body. This ointment should be a petroleum ointment. This will seal in moisture and help prevent dryness. The thicker the ointment, the better. These should be unscented.   Keep fingernails cut short. Children with eczema may need to wear soft gloves or mittens at night after applying an ointment.   Dress in clothes made of cotton or cotton blends. Dress lightly, because heat increases itching.   A child with eczema should stay away from anyone with fever blisters or cold sores. The virus that causes fever blisters (herpes simplex) can cause a serious skin infection in children with eczema. SEEK MEDICAL CARE IF:   Your itching interferes with sleep.   Your rash gets worse or is not better within 1 week after starting treatment.   You see pus or soft yellow scabs in the rash area.   You have a fever.   You have a rash flare-up after contact with someone who has fever blisters.  Document Released: 03/03/2000 Document Revised: 12/25/2012 Document Reviewed: 10/07/2012 [employer] Patient Information 2015 [employer]. This information   is not intended to replace advice given to you by your health care provider. Make sure you discuss any questions you have with your health care provider.  

## 2014-08-31 NOTE — ED Notes (Signed)
Pt. Stated, I've had this rash or ringworm for about a year.

## 2014-08-31 NOTE — ED Provider Notes (Signed)
CSN: [id number]     Arrival date & time 08/31/14  1055 History  This chart was scribed for non-physician practitioner, Teressa Lower, NP, working with Jerelyn Scott, MD, by Ronney Lion, ED Scribe. This patient was seen in room TR11C/TR11C and the patient's care was started at 11:35 AM.    Chief Complaint  Patient presents with  . Rash  . Skin Problem   HPI  HPI Comments: Charles [name] is a 64 y.o. male who presents to the Emergency Department complaining of constant circular rashes on his arms and back that began 1 year ago. Patient had seen his PCP on [facility] several times for this, and he was diagnosed with eczema. However, he states the cream or nonsteroidal pills that was prescribed by his PCP has not been working. He denies a history of ever being formally diagnosed with DM, but states he has a family history of DM and suspects he may have it. He denies taking any DM medication. Patient has known allergies to Benadryl.   History reviewed. No pertinent past medical history. Past Surgical History  Procedure Laterality Date  . No past surgeries     No family history on file. History  Substance Use Topics  . Smoking status: Current Every Day Smoker -- 1.00 packs/day    Types: Cigarettes  . Smokeless tobacco: Not on file  . Alcohol Use: Yes    Review of Systems  Skin: Positive for rash.  All other systems reviewed and are negative.     Allergies  Review of patient's allergies indicates no known allergies.  Home Medications   Prior to Admission medications   Medication Sig Start Date End Date Taking? Authorizing Provider  cephALEXin (KEFLEX) 250 MG capsule Take 1 capsule (250 mg total) by mouth 4 (four) times daily. 03/28/13   Marissa Sciacca, PA-C  hydrOXYzine (ATARAX/VISTARIL) 25 MG tablet Take 0.5 tablets (12.5 mg total) by mouth every 8 (eight) hours as needed for itching. 03/28/13   Marissa Sciacca, PA-C  mupirocin cream (BACTROBAN) 2 % Apply topically 3 (three)  times daily. 11/24/12   Junious Silk, PA-C  naproxen sodium (ANAPROX) 220 MG tablet Take 440 mg by mouth 2 (two) times daily as needed.     Historical Provider, MD  predniSONE (DELTASONE) 20 MG tablet 3 tabs po day one, then 2 tabs daily x 4 days 03/28/13   Marissa Sciacca, PA-C   BP 155/89 mmHg  Pulse 82  Temp(Src) 98.3 F (36.8 C)  Resp 17  Ht 5\' 7"  (1.702 m)  Wt 155 lb (70.308 kg)  BMI 24.27 kg/m2  SpO2 100% Physical Exam  Constitutional: He is oriented to person, place, and time. He appears well-developed and well-nourished.  HENT:  Head: Normocephalic and atraumatic.  Cardiovascular: Normal rate and regular rhythm.   Pulmonary/Chest: Effort normal and breath sounds normal.  Neurological: He is alert and oriented to person, place, and time.  Skin:  Dry scaly skin noted to upper extremities and trunk. Some open areas that don't appear to be infected at this time  Nursing note and vitals reviewed.   ED Course  Procedures (including critical care time)  DIAGNOSTIC STUDIES: Oxygen Saturation is 100% on RA, normal by my interpretation.    COORDINATION OF CARE: 11:38 AM - Discussed treatment plan with pt at bedside which includes Rx steroids, and pt agreed to plan.   Labs Review Labs Reviewed - No data to display  Imaging Review No results found.   EKG Interpretation None  MDM   Final diagnoses:  Eczema   Rash consistent with eczema. Will treat with prednisone at this time  I personally performed the services described in this documentation, which was scribed in my presence. The recorded information has been reviewed and is accurate.     Teressa Lower, NP 08/31/14 1223  Jerelyn Scott, MD 08/31/14 1314

## 2014-09-14 ENCOUNTER — Encounter (HOSPITAL_COMMUNITY): Payer: Self-pay | Admitting: Emergency Medicine

## 2014-09-14 ENCOUNTER — Emergency Department (HOSPITAL_COMMUNITY)
Admission: EM | Admit: 2014-09-14 | Discharge: 2014-09-14 | Disposition: A | Discharge status: Home / Self Care | Payer: Medicare Other | Attending: Emergency Medicine | Admitting: Emergency Medicine

## 2014-09-14 DIAGNOSIS — Z79899 Other long term (current) drug therapy: Secondary | ICD-10-CM | POA: Insufficient documentation

## 2014-09-14 DIAGNOSIS — Z792 Long term (current) use of antibiotics: Secondary | ICD-10-CM | POA: Diagnosis not present

## 2014-09-14 DIAGNOSIS — Z720 Tobacco use: Secondary | ICD-10-CM | POA: Insufficient documentation

## 2014-09-14 DIAGNOSIS — Z760 Encounter for issue of repeat prescription: Secondary | ICD-10-CM | POA: Insufficient documentation

## 2014-09-14 DIAGNOSIS — L309 Dermatitis, unspecified: Secondary | ICD-10-CM | POA: Diagnosis present

## 2014-09-14 HISTORY — DX: Dermatitis, unspecified: L30.9

## 2014-09-14 MED ORDER — TRIAMCINOLONE ACETONIDE 0.1 % EX CREA: 1.0000 "application " | TOPICAL_CREAM | Freq: Three times a day (TID) | CUTANEOUS | Status: DC | PRN

## 2014-09-14 NOTE — Discharge Instructions (Signed)
Use Zyrtec for allergy symptoms of sneezing and runny nose everyday, triamcinolone cream for itching or rash, and continue your usual home medications. Get plenty of rest, drink plenty of fluids, shower with cool water (no hot steamy showers); use soap only on your "bits" (underarms and bottom); use cetaphil cleanser or dove soap when using soap; use aveeno, eucerin or aquaphor lotion after every bath/shower. Please followup with [facility] in 1 week to establish care and for ongoing management of your eczema. Return to the ER for changes or worsening symptoms.   Eczema Eczema, also called atopic dermatitis, is a skin disorder that causes inflammation of the skin. It causes a red rash and dry, scaly skin. The skin becomes very itchy. Eczema is generally worse during the cooler winter months and often improves with the warmth of summer. Eczema usually starts showing signs in infancy. Some children outgrow eczema, but it may last through adulthood.  CAUSES  The exact cause of eczema is not known, but it appears to run in families. People with eczema often have a family history of eczema, allergies, asthma, or hay fever. Eczema is not contagious. Flare-ups of the condition may be caused by:   Contact with something you are sensitive or allergic to.   Stress. SIGNS AND SYMPTOMS  Dry, scaly skin.   Red, itchy rash.   Itchiness. This may occur before the skin rash and may be very intense.  DIAGNOSIS  The diagnosis of eczema is usually made based on symptoms and medical history. TREATMENT  Eczema cannot be cured, but symptoms usually can be controlled with treatment and other strategies. A treatment plan might include:  Controlling the itching and scratching.   Use over-the-counter antihistamines as directed for itching. This is especially useful at night when the itching tends to be worse.   Use over-the-counter steroid creams as directed for itching.   Avoid scratching.  Scratching makes the rash and itching worse. It may also result in a skin infection (impetigo) due to a break in the skin caused by scratching.   Keeping the skin well moisturized with creams every day. This will seal in moisture and help prevent dryness. Lotions that contain alcohol and water should be avoided because they can dry the skin.   Limiting exposure to things that you are sensitive or allergic to (allergens).   Recognizing situations that cause stress.   Developing a plan to manage stress.  HOME CARE INSTRUCTIONS   Only take over-the-counter or prescription medicines as directed by your health care provider.   Do not use anything on the skin without checking with your health care provider.   Keep baths or showers short (5 minutes) in warm (not hot) water. Use mild cleansers for bathing. These should be unscented. You may add nonperfumed bath oil to the bath water. It is best to avoid soap and bubble bath.   Immediately after a bath or shower, when the skin is still damp, apply a moisturizing ointment to the entire body. This ointment should be a petroleum ointment. This will seal in moisture and help prevent dryness. The thicker the ointment, the better. These should be unscented.   Keep fingernails cut short. Children with eczema may need to wear soft gloves or mittens at night after applying an ointment.   Dress in clothes made of cotton or cotton blends. Dress lightly, because heat increases itching.   A child with eczema should stay away from anyone with fever blisters or cold sores.  The virus that causes fever blisters (herpes simplex) can cause a serious skin infection in children with eczema. SEEK MEDICAL CARE IF:   Your itching interferes with sleep.   Your rash gets worse or is not better within 1 week after starting treatment.   You see pus or soft yellow scabs in the rash area.   You have a fever.   You have a rash flare-up after contact with  someone who has fever blisters.  Document Released: 03/03/2000 Document Revised: 12/25/2012 Document Reviewed: 10/07/2012 [facility] Patient Information 2015 [place], [place]. This information is not intended to replace advice given to you by your health care provider. Make sure you discuss any questions you have with your health care provider.

## 2014-09-14 NOTE — ED Provider Notes (Signed)
CSN: [id number]     Arrival date & time 09/14/14  1652 History   This chart was scribed for non-physician practitioner, Allen Derry, PA-C working with Arby Barrette, MD, by Abel Presto, ED Scribe. This patient was seen in room TR05C/TR05C and the patient's care was started at 5:25 PM.      Chief Complaint  Patient presents with  . Medication Refill  . Eczema     Patient is a 64 y.o. male presenting with [name]. The history is provided by the patient. No language interpreter was used.  [name] Location:  Full body Quality: dryness and itchiness   Quality: not red   Severity:  Severe Onset quality:  Gradual Duration: chronic >1 year. Timing:  Constant Progression:  Unchanged Chronicity:  Chronic Context: not animal contact, not exposure to similar [name], not insect bite/sting, not new detergent/soap, not plant contact and not sick contacts   Relieved by:  Moisturizers (steroids) Worsened by:  Nothing tried Ineffective treatments:  None tried Associated symptoms: no abdominal pain, no diarrhea, no fever, no joint pain, no myalgias, no nausea, no shortness of breath, not vomiting and not wheezing    HPI Comments: Charles [name] is a 64 y.o. male who presents to the Emergency Department for medication refill. Pt was last seen in ED on 08/31/14 for h/o eczema and was prescribed Prednisone but states he has run out of the medication. Pt reports [name] is pruritic with dry skin but denies any redness. Pt states he uses an OTC lotion for relief. He denies any recent sick contacts, plant/animal exposure, tick bites, and new soaps or detergents. Pt denies fever, chills, nausea, vomiting, diarrhea, abdominal pain, constipation, hematochezia, melena, chest pain, SOB, wheezing, dysphagia, difficulty urinating, dysuria, hematuria, numbness, tingling, and weakness.  Pt does not have a PCP.   Past Medical History  Diagnosis Date  . Eczema    Past Surgical History  Procedure Laterality Date   . No past surgeries     No family history on file. History  Substance Use Topics  . Smoking status: Current Every Day Smoker -- 1.00 packs/day    Types: Cigarettes  . Smokeless tobacco: Not on file  . Alcohol Use: Yes    Review of Systems  Constitutional: Negative for fever and chills.  Respiratory: Negative for shortness of breath and wheezing.   Cardiovascular: Negative for chest pain.  Gastrointestinal: Negative for nausea, vomiting, abdominal pain and diarrhea.  Genitourinary: Negative for dysuria, hematuria and difficulty urinating.  Musculoskeletal: Negative for myalgias and arthralgias.  Skin: Positive for [name].  Allergic/Immunologic: Negative for immunocompromised state.  Neurological: Negative for weakness and numbness.   A complete 10 system review of systems was obtained and all systems are negative except as noted in the HPI and PMH.    Allergies  Review of patient's allergies indicates no known allergies.  Home Medications   Prior to Admission medications   Medication Sig Start Date End Date Taking? Authorizing Provider  cephALEXin (KEFLEX) 250 MG capsule Take 1 capsule (250 mg total) by mouth 4 (four) times daily. 03/28/13   Marissa Sciacca, PA-C  hydrOXYzine (ATARAX/VISTARIL) 25 MG tablet Take 0.5 tablets (12.5 mg total) by mouth every 8 (eight) hours as needed for itching. 03/28/13   Marissa Sciacca, PA-C  mupirocin cream (BACTROBAN) 2 % Apply topically 3 (three) times daily. 11/24/12   Junious Silk, PA-C  naproxen sodium (ANAPROX) 220 MG tablet Take 440 mg by mouth 2 (two) times daily as needed.  Historical Provider, MD  predniSONE (DELTASONE) 10 MG tablet 6 day step down dose 08/31/14   Teressa LowerVrinda Pickering, NP   BP 146/87 mmHg  Pulse 88  Temp(Src) 98 F (36.7 C) (Oral)  Resp 18  Ht 5\' 6"  (1.676 m)  Wt 162 lb 0.6 oz (73.5 kg)  BMI 26.17 kg/m2  SpO2 100% Physical Exam  Constitutional: He is oriented to person, place, and time. Vital signs are normal. He  appears well-developed and well-nourished.  Non-toxic appearance. No distress.  Afebrile, nontoxic, NAD  HENT:  Head: Normocephalic and atraumatic.  Mouth/Throat: Mucous membranes are normal.  Eyes: Conjunctivae and EOM are normal. Right eye exhibits no discharge. Left eye exhibits no discharge.  Neck: Normal range of motion. Neck supple.  Cardiovascular: Normal rate.   Pulmonary/Chest: Effort normal. No respiratory distress.  Abdominal: Normal appearance. He exhibits no distension.  Musculoskeletal: Normal range of motion.  Neurological: He is alert and oriented to person, place, and time. He has normal strength. No sensory deficit.  Skin: Skin is warm, dry and intact. [name] noted. [name] is maculopapular.  Maculopapular [name] diffusely, some scaling, dry and some erythema to certain areas, with excoriations, covering torso and arms. No interdigital webspace involvement. No cellulitis.  Psychiatric: He has a normal mood and affect.  Nursing note and vitals reviewed.   ED Course  Procedures (including critical care time) DIAGNOSTIC STUDIES: Oxygen Saturation is 100% on room air, normal by my interpretation.    COORDINATION OF CARE: 5:34 PM Discussed treatment plan with patient at beside, the patient agrees with the plan and has no further questions at this time.   Labs Review Labs Reviewed - No data to display  Imaging Review No results found.   EKG Interpretation None      MDM   Final diagnoses:  Eczema    64 y.o. male for evaluation of his eczema. Ongoing chronic issue. He was recently given a course of prednisone which helped significantly, but is worsening now that he is off the prednisone. Discussed that chronic steroid use is problematic, but discussed that triamcinolone cream could help. Discussed use of Dove soap and Aveeno. [facility] for establishment of care and ongoing management of his eczema.I explained the diagnosis and  have given explicit precautions to return to the ER including for any other new or worsening symptoms. The patient understands and accepts the medical plan as it's been dictated and I have answered their questions. Discharge instructions concerning home care and prescriptions have been given. The patient is STABLE and is discharged to home in good condition.   I personally performed the services described in this documentation, which was scribed in my presence. The recorded information has been reviewed and is accurate.  BP 146/87 mmHg  Pulse 88  Temp(Src) 98 F (36.7 C) (Oral)  Resp 18  Ht 5\' 6"  (1.676 m)  Wt 162 lb 0.6 oz (73.5 kg)  BMI 26.17 kg/m2  SpO2 100%  Meds ordered this encounter  Medications  . triamcinolone cream (KENALOG) 0.1 %    Sig: Apply 1 application topically 3 (three) times daily as needed.    Dispense:  30 g    Refill:  0    Order Specific Question:  Supervising Provider    Answer:  Eber HongMILLER, BRIAN [[id number]]       Billey Wojciak Camprubi-Soms, PA-C 09/14/14 1744  Arby BarretteMarcy Pfeiffer, MD 09/15/14 [phone]

## 2014-09-14 NOTE — ED Notes (Signed)
Pt reports he was prescribed soemthing for eczema which helped but now he is out of the medication and needing a refill.

## 2014-09-14 NOTE — ED Notes (Signed)
Declined W/C at D/C and was escorted to lobby by RN. 

## 2015-01-28 ENCOUNTER — Emergency Department (HOSPITAL_COMMUNITY)
Admission: EM | Admit: 2015-01-28 | Discharge: 2015-01-28 | Disposition: A | Discharge status: Home / Self Care | Payer: Medicare Other | Attending: Emergency Medicine | Admitting: Emergency Medicine

## 2015-01-28 ENCOUNTER — Encounter (HOSPITAL_COMMUNITY): Payer: Self-pay | Admitting: Family Medicine

## 2015-01-28 DIAGNOSIS — R21 Rash and other nonspecific skin eruption: Secondary | ICD-10-CM

## 2015-01-28 DIAGNOSIS — L259 Unspecified contact dermatitis, unspecified cause: Secondary | ICD-10-CM | POA: Diagnosis not present

## 2015-01-28 DIAGNOSIS — Z792 Long term (current) use of antibiotics: Secondary | ICD-10-CM | POA: Diagnosis not present

## 2015-01-28 DIAGNOSIS — Z720 Tobacco use: Secondary | ICD-10-CM | POA: Diagnosis not present

## 2015-01-28 DIAGNOSIS — L309 Dermatitis, unspecified: Secondary | ICD-10-CM

## 2015-01-28 MED ORDER — TRIAMCINOLONE ACETONIDE 0.1 % EX CREA: 1.0000 "application " | TOPICAL_CREAM | Freq: Two times a day (BID) | CUTANEOUS | Status: AC

## 2015-01-28 MED ORDER — HYDROXYZINE HCL 25 MG PO TABS: 25.0000 mg | ORAL_TABLET | Freq: Three times a day (TID) | ORAL | Status: DC | PRN

## 2015-01-28 MED ORDER — PREDNISONE 20 MG PO TABS: 60.0000 mg | ORAL_TABLET | Freq: Every day | ORAL | Status: DC

## 2015-01-28 NOTE — ED Provider Notes (Signed)
CSN: [id number]     Arrival date & time 01/28/15  1040 History   First MD Initiated Contact with Patient 01/28/15 1138     Chief Complaint  Patient presents with  . Allergic Reaction    HPI   Mr. Lamay is an 64 y.o. male with history of eczema and chronic skin lesions who presents to the ED for evaluation of rash/skin lesions. He has been evaluated in the ED several times in the past for the same. He states that the lesions are the same as his chronic lesions but feels that they are not improving. He complains of diffuse eczematous maculopapular lesions all over his body including his head, face, torso, and extremities. Mr. Muzquiz states that they are painful/burning and itchy. He states that some of the lesions have become open sores but denies any purulent discharge or uncontrolled bleeding. He states that steroid cream and steroid pills have helped in the past but he has not been able to f/u with PCP or dermatology due to finances. Denies fever, chills, numbness, tingling. Denies throat or tongue swelling, difficulty breathing, chest pain, SOB. Endorses weekly alcohol use and smokes tobacco and marijuana daily but denies other illicit drug use.   Past Medical History  Diagnosis Date  . Eczema    Past Surgical History  Procedure Laterality Date  . No past surgeries     History reviewed. No pertinent family history. Social History  Substance Use Topics  . Smoking status: Current Every Day Smoker -- 1.00 packs/day    Types: Cigarettes  . Smokeless tobacco: None  . Alcohol Use: Yes     Comment: Once a month.     Review of Systems  All other systems reviewed and are negative.     Allergies  Review of patient's allergies indicates no known allergies.  Home Medications   Prior to Admission medications   Medication Sig Start Date End Date Taking? Authorizing Provider  cephALEXin (KEFLEX) 250 MG capsule Take 1 capsule (250 mg total) by mouth 4 (four) times daily. 03/28/13    Marissa Sciacca, PA-C  hydrOXYzine (ATARAX/VISTARIL) 25 MG tablet Take 1 tablet (25 mg total) by mouth every 8 (eight) hours as needed. 01/28/15   Ace Gins Mavis Gravelle, PA-C  mupirocin cream (BACTROBAN) 2 % Apply topically 3 (three) times daily. 11/24/12   Junious Silk, PA-C  naproxen sodium (ANAPROX) 220 MG tablet Take 440 mg by mouth 2 (two) times daily as needed.     Historical Provider, MD  predniSONE (DELTASONE) 20 MG tablet Take 3 tablets (60 mg total) by mouth daily. 01/28/15   Ace Gins Hermila Millis, PA-C  triamcinolone cream (KENALOG) 0.1 % Apply 1 application topically 2 (two) times daily. 01/28/15   Ace Gins Anvitha Hutmacher, PA-C   BP 114/66 mmHg  Pulse 99  Temp(Src) 98 F (36.7 C) (Oral)  Resp 20  Ht  (1.702 m)  Wt 155 lb (70.308 kg)  BMI 24.27 kg/m2  SpO2 96% Physical Exam  Constitutional: He is oriented to person, place, and time.  HENT:  Right Ear: External ear normal.  Left Ear: External ear normal.  Nose: Nose normal.  Mouth/Throat: Oropharynx is clear and moist. No oropharyngeal exudate.  Eyes: Conjunctivae and EOM are normal. Pupils are equal, round, and reactive to light.  Neck: Normal range of motion. Neck supple.  Cardiovascular: Normal rate, regular rhythm, normal heart sounds and intact distal pulses.   No murmur heard. Pulmonary/Chest: Effort normal and breath sounds normal. No respiratory distress. He  has no wheezes.  Abdominal: Soft. Bowel sounds are normal. He exhibits no distension. There is no tenderness.  Musculoskeletal: Normal range of motion. He exhibits no edema or tenderness.  Lymphadenopathy:    He has no cervical adenopathy.  Neurological: He is alert and oriented to person, place, and time.  Skin:  Diffuse eczematous maculopapular rash over entire body. Several open lesions and excoriation marks on scalp, AC cubital fossa, and back. No active bleeding or purulent discharge. No areas of induration. No pustules.   Nursing note and vitals reviewed.   ED Course   Procedures (including critical care time) Labs Review Labs Reviewed - No data to display  Imaging Review No results found. I have personally reviewed and evaluated these images and lab results as part of my medical decision-making.   EKG Interpretation None      MDM   Final diagnoses:  Generalized rash  Eczema    Pt with known history of eczema and chronic skin lesions. Review of prior records reveals that he has been seen in the past for same complaints. Denies new symptoms or changes in appearance of rash. Pt is afebrile today. Initially tachycardic in triage but during our interview HR down to 80s. I have low suspicion for new infection or acute process. This appears to consistent with his chronic skin condition. I discussed that he really needs primary care/dermatology follow-up. Will give Rx for kenalog cream and 3 day course of PO prednisone. Pt may take Benadryl prn itching but I will also give him Rx for Vistaril to use instead of benadryl as he states that benadryl makes him drowsy. Provided pt with resource guide. Return precautions given.     Carlene CoriaSerena Y Kamalei Roeder, PA-C 01/28/15 1427  Richardean Canalavid H Yao, MD 01/29/15 [phone]

## 2015-01-28 NOTE — ED Notes (Signed)
Per EMS pt c/o allergic reaction presenting as hives all over which he has scratched open x 2 months and cough, has [facility] and given benadryl and prednisone without much assistance. Per EMS, doctors have not discovered source of ongoing allergic reaction.

## 2015-01-28 NOTE — Discharge Instructions (Signed)
You were seen in the emergency room today for your chronic rash. You do not have a fever today. There are no signs of any emergent changes. Please follow-up with a primary care provider for ongoing management. As discussed, take the prednisone (steroid) pills as directed. You can also use the steroid cream on affected areas. I am also giving you a prescription for hydroxyzine, a medicine similar to Benadryl, for the itching.    Emergency Department Resource Guide 1) Find a Doctor and Pay Out of Pocket Although you won't have to find out who is covered by your insurance plan, it is a good idea to ask around and get recommendations. You will then need to call the office and see if the doctor you have chosen will accept you as a new patient and what types of options they offer for patients who are self-pay. Some doctors offer discounts or will set up payment plans for their patients who do not have insurance, but you will need to ask so you aren't surprised when you get to your appointment.  2) Contact Your Local Health Department Not all health departments have doctors that can see patients for sick visits, but many do, so it is worth a call to see if yours does. If you don't know where your local health department is, you can check in your phone book. The CDC also has a tool to help you locate your state's health department, and many state websites also have listings of all of their local health departments.  3) Find a Walk-in Clinic If your illness is not likely to be very severe or complicated, you may want to try a walk in clinic. These are popping up all over the country in pharmacies, drugstores, and shopping centers. They're usually staffed by nurse practitioners or physician assistants that have been trained to treat common illnesses and complaints. They're usually fairly quick and inexpensive. However, if you have serious medical issues or chronic medical problems, these are probably not your best  option.  No Primary Care Doctor: - Call Health Connect at  [phone] - they can help you locate a primary care doctor that  accepts your insurance, provides certain services, etc. - Physician Referral Service- [phone]  Chronic Pain Problems: Organization         Address  Phone   Notes  [facility]  [phone] Patients need to be referred by their primary care doctor.   Medication Assistance: Organization         Address  Phone   Notes  [facility] [address] [place], [place] [address] [phone] --Must be a resident of [place] -- Must have NO insurance coverage whatsoever (no Medicaid/ Medicare, etc.) -- The pt. MUST have a primary care doctor that directs their care regularly and follows them in the community   [facility]  [phone]   [employer]  [phone]    Agencies that provide inexpensive medical care: Organization         Address  Phone   Notes  [facility]  [phone]   [facility]    [phone]   [facility] [address] [place], [place] [address] [phone]   [facility] [address], [place] [phone]   [facility]    [phone]   [facility]    [phone]  [facility]  [address], [place] Phone:  [phone], Fax:  [phone] Hours of Operation:  9 am - 6 pm, M-F.  Also accepts Medicaid/Medicare and self-pay.  [facility]  [address],  Phone: [phone], Fax: [phone]. Hours of Operation:  8:30 am - 5:30 pm, M-F.  Also accepts Medicaid and self-pay.  [facility] [place] [address] [place] Phone: [phone]   [facility] [address] Natasha Bence [place], [place] [phone], Ext. [phone] Mondays & Thursdays: 7-9 AM.  First 15  patients are seen on a first come, first serve basis.    Medicaid-accepting [facility] Providers:  Organization         Address  Phone   Notes  [facility] [address],  [phone] Also accepts self-pay patients.  [facility] [address] Laurell Josephs [place], [place]  [phone]   [facility] [address], [place] [phone]   [facility] [address], [place] [phone]   Renaye Rakers [address], [place]   [phone] Only accepts [place] patients after they have their name applied to their card.   Self-Pay (no insurance) in [facility]:  Organization         Address  Phone   Notes  Sickle Cell Patients, [facility] [address] [place], [place] [phone]   [facility] [address] [place], [place] [phone]   [facility]  [address], [place] [phone]   [facility]/Dr. Osei-Bonsu  [address], [place] or [address], [place] [phone] Phone number for both [place] and [place] locations is the same.  [facility] [address], [place] [phone]   [facility] [address] [place] or [address] [phone]   [facility] [address] [place] [phone], phone; [phone], fax Sees patients 1st and 3rd Saturday of every month.  Must not qualify for public or private insurance (i.e. Medicaid, Medicare, [place] Health Choice, Veterans' Benefits)  Household income should be no more than 200% of the poverty level The clinic cannot treat you if you are pregnant or think you are pregnant  Sexually transmitted diseases are not treated at the clinic.    Please obtain all of  your results from medical records or have your doctors office obtain the results - share them with your doctor - you should be seen at your doctors office in the next 2 days. Call today to arrange your follow up. Take the medications as prescribed. Please review all of the medicines and only take them if you do not have an allergy to them. Please be aware that if you are taking birth control pills, taking other prescriptions, ESPECIALLY ANTIBIOTICS may make the birth control ineffective - if this is the case, either do not engage in sexual activity or use alternative methods of birth control such as condoms until you have finished the medicine and your family doctor says it is OK to restart them. If you are on a blood thinner such as COUMADIN, be aware that any other medicine that you take may cause  the coumadin to either work too much, or not enough - you should have your coumadin level rechecked in next 7 days if this is the case.  ?  It is also a possibility that you have an allergic reaction to any of the medicines that you have been prescribed - Everybody reacts differently to medications and while MOST people have no trouble with most medicines, you may have a reaction such as nausea, vomiting, rash, swelling, shortness of breath. If this is the case, please stop taking the medicine immediately and contact your physician.  ?  You should return to the ER if you develop severe or worsening symptoms.

## 2015-02-08 ENCOUNTER — Emergency Department (HOSPITAL_COMMUNITY): Payer: Medicare Other

## 2015-02-08 ENCOUNTER — Encounter (HOSPITAL_COMMUNITY): Payer: Self-pay | Admitting: *Deleted

## 2015-02-08 ENCOUNTER — Inpatient Hospital Stay (HOSPITAL_COMMUNITY)
Admission: EM | Admit: 2015-02-08 | Discharge: 2015-02-21 | DRG: 872 | Disposition: A | Discharge status: Other Acute Inpatient Hospital | Payer: Medicare Other | Attending: Internal Medicine | Admitting: Internal Medicine

## 2015-02-08 DIAGNOSIS — R52 Pain, unspecified: Secondary | ICD-10-CM

## 2015-02-08 DIAGNOSIS — D649 Anemia, unspecified: Secondary | ICD-10-CM | POA: Diagnosis present

## 2015-02-08 DIAGNOSIS — A4902 Methicillin resistant Staphylococcus aureus infection, unspecified site: Secondary | ICD-10-CM | POA: Diagnosis not present

## 2015-02-08 DIAGNOSIS — B9561 Methicillin susceptible Staphylococcus aureus infection as the cause of diseases classified elsewhere: Secondary | ICD-10-CM | POA: Diagnosis not present

## 2015-02-08 DIAGNOSIS — A4102 Sepsis due to Methicillin resistant Staphylococcus aureus: Secondary | ICD-10-CM | POA: Diagnosis not present

## 2015-02-08 DIAGNOSIS — N183 Chronic kidney disease, stage 3 (moderate): Secondary | ICD-10-CM | POA: Diagnosis not present

## 2015-02-08 DIAGNOSIS — Z113 Encounter for screening for infections with a predominantly sexual mode of transmission: Secondary | ICD-10-CM | POA: Diagnosis present

## 2015-02-08 DIAGNOSIS — N179 Acute kidney failure, unspecified: Secondary | ICD-10-CM | POA: Diagnosis present

## 2015-02-08 DIAGNOSIS — E162 Hypoglycemia, unspecified: Secondary | ICD-10-CM | POA: Diagnosis present

## 2015-02-08 DIAGNOSIS — A419 Sepsis, unspecified organism: Secondary | ICD-10-CM | POA: Diagnosis present

## 2015-02-08 DIAGNOSIS — M1711 Unilateral primary osteoarthritis, right knee: Secondary | ICD-10-CM | POA: Diagnosis present

## 2015-02-08 DIAGNOSIS — L539 Erythematous condition, unspecified: Secondary | ICD-10-CM | POA: Diagnosis not present

## 2015-02-08 DIAGNOSIS — Z95828 Presence of other vascular implants and grafts: Secondary | ICD-10-CM

## 2015-02-08 DIAGNOSIS — A4101 Sepsis due to Methicillin susceptible Staphylococcus aureus: Secondary | ICD-10-CM | POA: Diagnosis present

## 2015-02-08 DIAGNOSIS — N172 Acute kidney failure with medullary necrosis: Secondary | ICD-10-CM | POA: Diagnosis not present

## 2015-02-08 DIAGNOSIS — M00061 Staphylococcal arthritis, right knee: Secondary | ICD-10-CM | POA: Diagnosis not present

## 2015-02-08 DIAGNOSIS — E875 Hyperkalemia: Secondary | ICD-10-CM | POA: Diagnosis not present

## 2015-02-08 DIAGNOSIS — L03119 Cellulitis of unspecified part of limb: Secondary | ICD-10-CM | POA: Diagnosis not present

## 2015-02-08 DIAGNOSIS — B954 Other streptococcus as the cause of diseases classified elsewhere: Secondary | ICD-10-CM | POA: Diagnosis present

## 2015-02-08 DIAGNOSIS — L0291 Cutaneous abscess, unspecified: Secondary | ICD-10-CM | POA: Diagnosis not present

## 2015-02-08 DIAGNOSIS — L03115 Cellulitis of right lower limb: Secondary | ICD-10-CM | POA: Diagnosis present

## 2015-02-08 DIAGNOSIS — F17210 Nicotine dependence, cigarettes, uncomplicated: Secondary | ICD-10-CM | POA: Diagnosis present

## 2015-02-08 DIAGNOSIS — D6489 Other specified anemias: Secondary | ICD-10-CM | POA: Diagnosis not present

## 2015-02-08 DIAGNOSIS — L409 Psoriasis, unspecified: Secondary | ICD-10-CM | POA: Diagnosis present

## 2015-02-08 DIAGNOSIS — M25461 Effusion, right knee: Secondary | ICD-10-CM | POA: Diagnosis present

## 2015-02-08 DIAGNOSIS — I9589 Other hypotension: Secondary | ICD-10-CM | POA: Diagnosis present

## 2015-02-08 DIAGNOSIS — M25561 Pain in right knee: Secondary | ICD-10-CM | POA: Diagnosis present

## 2015-02-08 DIAGNOSIS — R6520 Severe sepsis without septic shock: Secondary | ICD-10-CM | POA: Diagnosis present

## 2015-02-08 DIAGNOSIS — L239 Allergic contact dermatitis, unspecified cause: Secondary | ICD-10-CM | POA: Diagnosis present

## 2015-02-08 DIAGNOSIS — T82898A Other specified complication of vascular prosthetic devices, implants and grafts, initial encounter: Secondary | ICD-10-CM

## 2015-02-08 DIAGNOSIS — E870 Hyperosmolality and hypernatremia: Secondary | ICD-10-CM | POA: Diagnosis present

## 2015-02-08 DIAGNOSIS — N170 Acute kidney failure with tubular necrosis: Secondary | ICD-10-CM | POA: Diagnosis not present

## 2015-02-08 DIAGNOSIS — Z87898 Personal history of other specified conditions: Secondary | ICD-10-CM | POA: Diagnosis not present

## 2015-02-08 DIAGNOSIS — M0000 Staphylococcal arthritis, unspecified joint: Secondary | ICD-10-CM | POA: Diagnosis not present

## 2015-02-08 DIAGNOSIS — R509 Fever, unspecified: Secondary | ICD-10-CM

## 2015-02-08 DIAGNOSIS — Z8249 Family history of ischemic heart disease and other diseases of the circulatory system: Secondary | ICD-10-CM | POA: Diagnosis not present

## 2015-02-08 DIAGNOSIS — R21 Rash and other nonspecific skin eruption: Secondary | ICD-10-CM | POA: Diagnosis not present

## 2015-02-08 DIAGNOSIS — L0390 Cellulitis, unspecified: Secondary | ICD-10-CM | POA: Diagnosis present

## 2015-02-08 DIAGNOSIS — N390 Urinary tract infection, site not specified: Secondary | ICD-10-CM | POA: Diagnosis present

## 2015-02-08 DIAGNOSIS — R7881 Bacteremia: Secondary | ICD-10-CM | POA: Diagnosis not present

## 2015-02-08 DIAGNOSIS — Z789 Other specified health status: Secondary | ICD-10-CM | POA: Diagnosis present

## 2015-02-08 DIAGNOSIS — Z9119 Patient's noncompliance with other medical treatment and regimen: Secondary | ICD-10-CM

## 2015-02-08 DIAGNOSIS — I878 Other specified disorders of veins: Secondary | ICD-10-CM

## 2015-02-08 LAB — CBC WITH DIFFERENTIAL/PLATELET
BASOS ABS: 0.2 10*3/uL — AB (ref 0.0–0.1)
Basophils Relative: 1 %
Eosinophils Absolute: 1.3 10*3/uL — ABNORMAL HIGH (ref 0.0–0.7)
Eosinophils Relative: 6 %
HEMATOCRIT: 34.3 % — AB (ref 39.0–52.0)
HEMOGLOBIN: 10.7 g/dL — AB (ref 13.0–17.0)
LYMPHS PCT: 21 %
Lymphs Abs: 4.5 10*3/uL — ABNORMAL HIGH (ref 0.7–4.0)
MCH: 27.5 pg (ref 26.0–34.0)
MCHC: 31.2 g/dL (ref 30.0–36.0)
MCV: 88.2 fL (ref 78.0–100.0)
MONOS PCT: 6 %
Monocytes Absolute: 1.3 10*3/uL — ABNORMAL HIGH (ref 0.1–1.0)
NEUTROS ABS: 14.2 10*3/uL — AB (ref 1.7–7.7)
NEUTROS PCT: 66 %
Platelets: 388 10*3/uL (ref 150–400)
RBC: 3.89 MIL/uL — ABNORMAL LOW (ref 4.22–5.81)
RDW: 18.1 % — ABNORMAL HIGH (ref 11.5–15.5)
WBC: 21.5 10*3/uL — ABNORMAL HIGH (ref 4.0–10.5)

## 2015-02-08 LAB — SYNOVIAL CELL COUNT + DIFF, W/ CRYSTALS
CRYSTALS FLUID: NONE SEEN
Eosinophils-Synovial: 1 % (ref 0–1)
LYMPHOCYTES-SYNOVIAL FLD: 1 % (ref 0–20)
Monocyte-Macrophage-Synovial Fluid: 10 % — ABNORMAL LOW (ref 50–90)
NEUTROPHIL, SYNOVIAL: 88 % — AB (ref 0–25)
WBC, Synovial: 61207 /mm3 — ABNORMAL HIGH (ref 0–200)

## 2015-02-08 LAB — URINALYSIS, ROUTINE W REFLEX MICROSCOPIC
Bilirubin Urine: NEGATIVE
Glucose, UA: NEGATIVE mg/dL
Ketones, ur: NEGATIVE mg/dL
NITRITE: POSITIVE — AB
PH: 6 (ref 5.0–8.0)
Protein, ur: 30 mg/dL — AB
SPECIFIC GRAVITY, URINE: 1.019 (ref 1.005–1.030)

## 2015-02-08 LAB — COMPREHENSIVE METABOLIC PANEL
ALBUMIN: 2.2 g/dL — AB (ref 3.5–5.0)
ALT: 21 U/L (ref 17–63)
ANION GAP: 9 (ref 5–15)
AST: 30 U/L (ref 15–41)
Alkaline Phosphatase: 101 U/L (ref 38–126)
BILIRUBIN TOTAL: 1.5 mg/dL — AB (ref 0.3–1.2)
BUN: 22 mg/dL — ABNORMAL HIGH (ref 6–20)
CO2: 22 mmol/L (ref 22–32)
Calcium: 7.6 mg/dL — ABNORMAL LOW (ref 8.9–10.3)
Chloride: 106 mmol/L (ref 101–111)
Creatinine, Ser: 1.55 mg/dL — ABNORMAL HIGH (ref 0.61–1.24)
GFR calc non Af Amer: 45 mL/min — ABNORMAL LOW (ref >60)
GFR, EST AFRICAN AMERICAN: 52 mL/min — AB (ref >60)
GLUCOSE: 89 mg/dL (ref 65–99)
POTASSIUM: 4.6 mmol/L (ref 3.5–5.1)
SODIUM: 137 mmol/L (ref 135–145)
Total Protein: 6.2 g/dL — ABNORMAL LOW (ref 6.5–8.1)

## 2015-02-08 LAB — I-STAT CG4 LACTIC ACID, ED
LACTIC ACID, VENOUS: 2.81 mmol/L — AB (ref 0.5–2.0)
Lactic Acid, Venous: 5.22 mmol/L (ref 0.5–2.0)

## 2015-02-08 LAB — URINE MICROSCOPIC-ADD ON: SQUAMOUS EPITHELIAL / LPF: NONE SEEN

## 2015-02-08 MED ORDER — ONDANSETRON HCL 4 MG/2ML IJ SOLN: 4.0000 mg | Freq: Four times a day (QID) | INTRAMUSCULAR | Status: DC | PRN

## 2015-02-08 MED ORDER — ONDANSETRON HCL 4 MG PO TABS: 4.0000 mg | ORAL_TABLET | Freq: Four times a day (QID) | ORAL | Status: DC | PRN

## 2015-02-08 MED ORDER — SODIUM CHLORIDE 0.9 % IJ SOLN
3.0000 mL | Freq: Two times a day (BID) | INTRAMUSCULAR | Status: DC
  Administered 2015-02-08 – 2015-02-20 (×9): 3 mL via INTRAVENOUS

## 2015-02-08 MED ORDER — VANCOMYCIN HCL 10 G IV SOLR
1250.0000 mg | INTRAVENOUS | Status: DC
  Filled 2015-02-08: qty 1250

## 2015-02-08 MED ORDER — SODIUM CHLORIDE 0.9 % IV BOLUS (SEPSIS)
30.0000 mL/kg | Freq: Once | INTRAVENOUS | Status: AC
  Administered 2015-02-08: 2109 mL via INTRAVENOUS

## 2015-02-08 MED ORDER — SODIUM CHLORIDE 0.9 % IV BOLUS (SEPSIS)
1000.0000 mL | Freq: Once | INTRAVENOUS | Status: AC
  Administered 2015-02-08: 1000 mL via INTRAVENOUS

## 2015-02-08 MED ORDER — MORPHINE SULFATE (PF) 2 MG/ML IV SOLN
2.0000 mg | INTRAVENOUS | Status: DC | PRN
  Administered 2015-02-09 – 2015-02-16 (×8): 2 mg via INTRAVENOUS
  Filled 2015-02-08 (×8): qty 1

## 2015-02-08 MED ORDER — ACETAMINOPHEN 650 MG RE SUPP: 650.0000 mg | Freq: Four times a day (QID) | RECTAL | Status: DC | PRN

## 2015-02-08 MED ORDER — PIPERACILLIN-TAZOBACTAM 3.375 G IVPB
3.3750 g | Freq: Three times a day (TID) | INTRAVENOUS | Status: DC
  Administered 2015-02-08 – 2015-02-10 (×5): 3.375 g via INTRAVENOUS
  Filled 2015-02-08 (×5): qty 50

## 2015-02-08 MED ORDER — SODIUM CHLORIDE 0.9 % IV SOLN
INTRAVENOUS | Status: AC
  Administered 2015-02-08 – 2015-02-09 (×3): 1000 mL via INTRAVENOUS

## 2015-02-08 MED ORDER — LIDOCAINE HCL 2 % IJ SOLN
10.0000 mL | Freq: Once | INTRAMUSCULAR | Status: AC
  Administered 2015-02-08: 200 mg
  Filled 2015-02-08: qty 20

## 2015-02-08 MED ORDER — PIPERACILLIN-TAZOBACTAM 3.375 G IVPB 30 MIN
3.3750 g | Freq: Once | INTRAVENOUS | Status: AC
  Administered 2015-02-08: 3.375 g via INTRAVENOUS
  Filled 2015-02-08: qty 50

## 2015-02-08 MED ORDER — VANCOMYCIN HCL IN DEXTROSE 1-5 GM/200ML-% IV SOLN
1000.0000 mg | Freq: Once | INTRAVENOUS | Status: AC
  Administered 2015-02-08: 1000 mg via INTRAVENOUS
  Filled 2015-02-08: qty 200

## 2015-02-08 MED ORDER — ACETAMINOPHEN 325 MG PO TABS: 650.0000 mg | ORAL_TABLET | Freq: Four times a day (QID) | ORAL | Status: DC | PRN

## 2015-02-08 NOTE — ED Notes (Signed)
PATIENT SIGNED FOR INFORMED CONSENT OF RIGHT KNEE ASPIRATION. DR. Brett CanalesSTEVE NORRIS SIGNED AND Terrilyn SaverJOANNA Breean Nannini RN WITNESSED SIGNATURE.

## 2015-02-08 NOTE — ED Notes (Signed)
Bed: WA20 Expected date:  Expected time:  Means of arrival:  Comments: EMS 

## 2015-02-08 NOTE — Consult Note (Signed)
Reason for Consult:Rule out septic knee Referring Physician: EDP  Charles [name] is an 64 y.o. male.  HPI: 64 yo male with known history of severe psoriasis with recent ED visit for worsening skin condition presents with fever, elevated WBC and complaints of increasing right knee pain.  Asked to see patient to rule out knee sepsis.  Past Medical History  Diagnosis Date  . Eczema     Past Surgical History  Procedure Laterality Date  . No past surgeries      Family History  Problem Relation Age of Onset  . Hypertension Other     Social History:  reports that he has been smoking Cigarettes.  He has been smoking about 1.00 pack per day. He does not have any smokeless tobacco history on file. He reports that he drinks alcohol. He reports that he does not use illicit drugs.  Allergies: No Known Allergies  Medications: I have reviewed the patient's current medications.  Results for orders placed or performed during the hospital encounter of 02/08/15 (from the past 48 hour(s))  Comprehensive metabolic panel     Status: Abnormal   Collection Time: 02/08/15  4:19 PM  Result Value Ref Range   Sodium 137 135 - 145 mmol/L   Potassium 4.6 3.5 - 5.1 mmol/L   Chloride 106 101 - 111 mmol/L   CO2 22 22 - 32 mmol/L   Glucose, Bld 89 65 - 99 mg/dL   BUN 22 (H) 6 - 20 mg/dL   Creatinine, Ser 9.34 (H) 0.61 - 1.24 mg/dL   Calcium 7.6 (L) 8.9 - 10.3 mg/dL   Total Protein 6.2 (L) 6.5 - 8.1 g/dL   Albumin 2.2 (L) 3.5 - 5.0 g/dL   AST 30 15 - 41 U/L   ALT 21 17 - 63 U/L   Alkaline Phosphatase 101 38 - 126 U/L   Total Bilirubin 1.5 (H) 0.3 - 1.2 mg/dL   GFR calc non Af Amer 45 (L) >60 mL/min   GFR calc Af Amer 52 (L) >60 mL/min    Comment: (NOTE) The eGFR has been calculated using the CKD EPI equation. This calculation has not been validated in all clinical situations. eGFR's persistently <60 mL/min signify possible Chronic Kidney Disease.    Anion gap 9 5 - 15  CBC WITH DIFFERENTIAL      Status: Abnormal   Collection Time: 02/08/15  4:19 PM  Result Value Ref Range   WBC 21.5 (H) 4.0 - 10.5 K/uL   RBC 3.89 (L) 4.22 - 5.81 MIL/uL   Hemoglobin 10.7 (L) 13.0 - 17.0 g/dL   HCT 62.5 (L) 31.0 - 33.7 %   MCV 88.2 78.0 - 100.0 fL   MCH 27.5 26.0 - 34.0 pg   MCHC 31.2 30.0 - 36.0 g/dL   RDW 52.6 (H) 16.1 - 19.6 %   Platelets 388 150 - 400 K/uL   Neutrophils Relative % 66 %   Lymphocytes Relative 21 %   Monocytes Relative 6 %   Eosinophils Relative 6 %   Basophils Relative 1 %   Neutro Abs 14.2 (H) 1.7 - 7.7 K/uL   Lymphs Abs 4.5 (H) 0.7 - 4.0 K/uL   Monocytes Absolute 1.3 (H) 0.1 - 1.0 K/uL   Eosinophils Absolute 1.3 (H) 0.0 - 0.7 K/uL   Basophils Absolute 0.2 (H) 0.0 - 0.1 K/uL   Smear Review MORPHOLOGY UNREMARKABLE   I-Stat CG4 Lactic Acid, ED  (not at  [facility])     Status: Abnormal  Collection Time: 02/08/15  4:28 PM  Result Value Ref Range   Lactic Acid, Venous 5.22 (HH) 0.5 - 2.0 mmol/L  Urinalysis, Routine w reflex microscopic (not at [facility])     Status: Abnormal   Collection Time: 02/08/15  5:51 PM  Result Value Ref Range   Color, Urine AMBER (A) YELLOW    Comment: BIOCHEMICALS MAY BE AFFECTED BY COLOR   APPearance CLOUDY (A) CLEAR   Specific Gravity, Urine 1.019 1.005 - 1.030   pH 6.0 5.0 - 8.0   Glucose, UA NEGATIVE NEGATIVE mg/dL   Hgb urine dipstick MODERATE (A) NEGATIVE   Bilirubin Urine NEGATIVE NEGATIVE   Ketones, ur NEGATIVE NEGATIVE mg/dL   Protein, ur 30 (A) NEGATIVE mg/dL   Nitrite POSITIVE (A) NEGATIVE   Leukocytes, UA LARGE (A) NEGATIVE  Urine microscopic-add on     Status: Abnormal   Collection Time: 02/08/15  5:51 PM  Result Value Ref Range   Squamous Epithelial / LPF NONE SEEN NONE SEEN    Comment: Please note change in reference range.   WBC, UA TOO NUMEROUS TO COUNT 0 - 5 WBC/hpf    Comment: Please note change in reference range.   RBC / HPF 6-30 0 - 5 RBC/hpf    Comment: Please note change in reference range.   Bacteria, UA MANY (A) NONE  SEEN    Comment: Please note change in reference range.  I-Stat CG4 Lactic Acid, ED  (not at  [facility])     Status: Abnormal   Collection Time: 02/08/15  6:33 PM  Result Value Ref Range   Lactic Acid, Venous 2.81 (HH) 0.5 - 2.0 mmol/L   Comment NOTIFIED PHYSICIAN     Dg Chest Port 1 View  02/08/2015  CLINICAL DATA:  64 year old male with fever EXAM: PORTABLE CHEST 1 VIEW COMPARISON:  Right knee radiographs obtained concurrently FINDINGS: Heart is upper limits of normal for size. Mediastinal contours are within normal limits. Atherosclerotic calcifications are present in the transverse aorta. The lungs are clear. No pleural effusion or pneumothorax. No acute osseous abnormality. IMPRESSION: 1. Borderline cardiomegaly. 2. Aortic atherosclerosis. 3. No acute cardiopulmonary process. Electronically Signed   By: Jacqulynn Cadet M.D.   On: 02/08/2015 17:50   Dg Knee Right Port  02/08/2015  CLINICAL DATA:  64 year old male with 1 day history of right knee pain and low-grade fever EXAM: PORTABLE RIGHT KNEE - 1-2 VIEW COMPARISON:  Prior radiographs of the lower leg including the ankle 09/27/2010 FINDINGS: Positive for small ankle joint effusion. No evidence of acute fracture or malalignment. Normal bony mineralization. No lytic or blastic osseous lesion. No periosteal reaction. Mild tricompartmental degenerative change. IMPRESSION: 1. Positive for small suprapatellar knee joint effusion. Septic arthritis should be considered within the differential given the clinical history of fever. Additionally, degenerative and inflammatory effusions are considerations. 2. Mild tricompartmental osteoarthritis. Electronically Signed   By: Jacqulynn Cadet M.D.   On: 02/08/2015 17:52    ROS Blood pressure 133/71, pulse 117, temperature 101.1 F (38.4 C), temperature source Rectal, resp. rate 28, weight 70.308 kg (155 lb), SpO2 96 %. Physical Exam Patient covered in psoriatic/eczema lesions with right knee held in  slight flexion. Obvious cellulitis present in numerous areas of the body and numerous draining lesions. Right knee with obvious effusion. Tender to palpation and painful to range the knee.  Distally no pain with palpation and ROM of the ankle.  Pulses and sensation intact RLE.  No pain with left knee and leg ROM.  Assessment/Plan: Severe  Psoriasis with right knee effusion and possible sepsis.  Cellulitis noted in the leg and other areas of the body. Recommended knee aspiration for gram stain(stat) and culture and cell count and differential.  Patient understands the need to rule out knee sepsis and agreed to the procedure.  Informed consent obtained from the patient and with sterile technique, the right knee was aspirated without difficulty.  A cloudy aspirate was obtained and sent for cell count and diff and gram stain and culture.  Will follow results closely and keep npo for possible arthroscopic I+D later tonight if cleared medically.  Kiva Norland,STEVEN R 02/08/2015, 7:53 PM  [phone]

## 2015-02-08 NOTE — ED Notes (Addendum)
Per EMS, pt complains of right knee pain since yesterday. Pt states he was working yesterday and was standing frequently. EMS states pt had tympanic temperature of 99.9. Pt has dry, blistered skin. Pt states he has been using cream and prednisone prescribed at the ED. Pt states he has been without power for the past 2 days. Pt also has swollen scrotum and penis for the past 3 days.

## 2015-02-08 NOTE — H&P (Signed)
PCP: Geraldo PitterBLAND,[name], MD    Referring provider Keenan BachelorSofia   Chief Complaint:  Pain in right knee HPI: Charles [name] is a 64 y.o. male   has a past medical history of Eczema.   Presented with  24 hours of right knee pain. Patient felt like he was standing   for prolonged period time yesterday but otherwise did not endorse any injury. Patient has history of diffuse rash that she has been progressive over the past 2 years. He has been told that this was an allergic dermatitis and or psoriasis at some point patient may have gone to dermatologist at least once but I'm unsure if a definitive diagnosis has been made. He has been using triamcinolone cream and was seen at [facility] emergency department in October was diagnosed with eczema was given prescription for prednisone taper but not currently is off medications. Patient states that in the past prednisone has helped him somewhat. Patient has been having scaly areas over his body noted some crusty discharge in his eyes. His knee as well as scrotum has been swollen for past 24 hours. In Emergency department he was found to have fever 101.1 white blood cell count 21.5 hemoglobin 10.7 creatinine up to 1.55. Elevated lactic acid of 5.  Patient was given 2 L of normal saline and start make a mycin and Zosyn blood cultures were obtained urine showed white blood cell count to numerous to count and many bacteria as if positive nitrites and culture has been obtained. Repeat lactic acid was down to 2.8. Plain films of the knee showed joint effusion but no fracture. Given severity of [facility]. Dr. Devonne DoughtyNoris with orthopedics has been consulted regarding knee swelling with effusion. Patient was seen by Dr. Ranell PatrickNorris right knee was tapped if white blood cell count being 61,000 predominantly neutrophil culture has been sent. Patient made nothing by mouth for possible knee arthroscopy in the morning once his vitals stable.  Hospitalist was called for  admission for sepsis and possible septic knee versus cellulitis and UTI in the setting of prednisone use for chronic psoriasis  Review of Systems:    Pertinent positives include: Fevers, chills, fatigue, rash, dysuria,  Constitutional:  No weight loss, night sweats,  weight loss  HEENT:  No headaches, Difficulty swallowing,Tooth/dental problems,Sore throat,  No sneezing, itching, ear ache, nasal congestion, post nasal drip,  Cardio-vascular:  No chest pain, Orthopnea, PND, anasarca, dizziness, palpitations.no Bilateral lower extremity swelling  GI:  No heartburn, indigestion, abdominal pain, nausea, vomiting, diarrhea, change in bowel habits, loss of appetite, melena, blood in stool, hematemesis Resp:  no shortness of breath at rest. No dyspnea on exertion, No excess mucus, no productive cough, No non-productive cough, No coughing up of blood.No change in color of mucus.No wheezing. Skin:    No jaundice GU:  no  change in color of urine, no urgency or frequency. No straining to urinate.  No flank pain.  Musculoskeletal:  No joint pain or no joint swelling. No decreased range of motion. No back pain.  Psych:  No change in mood or affect. No depression or anxiety. No memory loss.  Neuro: no localizing neurological complaints, no tingling, no weakness, no double vision, no gait abnormality, no slurred speech, no confusion  Otherwise ROS are negative except for above, 10 systems were reviewed  Past Medical History: Past Medical History  Diagnosis Date  . Eczema    Past Surgical History  Procedure Laterality Date  . No past surgeries  Medications: Prior to Admission medications   Medication Sig Start Date End Date Taking? Authorizing Provider  hydrOXYzine (ATARAX/VISTARIL) 25 MG tablet Take 1 tablet (25 mg total) by mouth every 8 (eight) hours as needed. Patient not taking: Reported on 02/08/2015 01/28/15   Ace Gins Sam, PA-C  predniSONE (DELTASONE) 20 MG tablet Take 3  tablets (60 mg total) by mouth daily. Patient not taking: Reported on 02/08/2015 01/28/15   Ace Gins Sam, PA-C  triamcinolone cream (KENALOG) 0.1 % Apply 1 application topically 2 (two) times daily. Patient not taking: Reported on 02/08/2015 01/28/15   Carlene Coria, PA-C    Allergies:  No Known Allergies  Social History:  Ambulatory   independently   Lives at home With family     reports that he has been smoking Cigarettes.  He has been smoking about 1.00 pack per day. He does not have any smokeless tobacco history on file. He reports that he drinks alcohol. He reports that he does not use illicit drugs.    Family History: family history includes Hypertension in his other.    Physical Exam: Patient Vitals for the past 24 hrs:  BP Temp Temp src Pulse Resp SpO2 Weight  02/08/15 1754 109/81 mmHg - - 115 16 100 % -  02/08/15 1704 116/63 mmHg - - - (!) 29 - -  02/08/15 1703 - - - 117 25 100 % -  02/08/15 1619 - - - - - - 70.308 kg (155 lb)  02/08/15 1458 - - - 112 - 96 % -  02/08/15 1445 104/57 mmHg 101.1 F (38.4 C) Rectal - 18 - -    1. General:  in No Acute distress 2. Psychological: Alert and   Oriented 3. Head/ENT:     Dry Mucous Membranes                          Head Non traumatic, neck supple                          Normal  Dentition 4. SKIN:  decreased Skin turgor,  Skin clean Dry multiple areas of redness swelling extreme excoriation with some sloughing noted necrotic smell noted              5. Heart: Regular rate and rhythm no Murmur, Rub or gallop 6. Lungs: Clear to auscultation bilaterally, no wheezes or crackles   7. Abdomen: Soft, non-tender, Non distended 8. Lower extremities: no clubbing, cyanosis, or edema 9. Neurologically Grossly intact, moving all 4 extremities equally 10. MSK: Normal range of motion  body mass index is 24.27 kg/(m^2).   Labs on Admission:   Results for orders placed or performed during the hospital encounter of  02/08/15 (from the past 24 hour(s))  Comprehensive metabolic panel     Status: Abnormal   Collection Time: 02/08/15  4:19 PM  Result Value Ref Range   Sodium 137 135 - 145 mmol/L   Potassium 4.6 3.5 - 5.1 mmol/L   Chloride 106 101 - 111 mmol/L   CO2 22 22 - 32 mmol/L   Glucose, Bld 89 65 - 99 mg/dL   BUN 22 (H) 6 - 20 mg/dL   Creatinine, Ser 1.61 (H) 0.61 - 1.24 mg/dL   Calcium 7.6 (L) 8.9 - 10.3 mg/dL   Total Protein 6.2 (L) 6.5 - 8.1 g/dL   Albumin 2.2 (L) 3.5 - 5.0 g/dL   AST 30  15 - 41 U/L   ALT 21 17 - 63 U/L   Alkaline Phosphatase 101 38 - 126 U/L   Total Bilirubin 1.5 (H) 0.3 - 1.2 mg/dL   GFR calc non Af Amer 45 (L) >60 mL/min   GFR calc Af Amer 52 (L) >60 mL/min   Anion gap 9 5 - 15  CBC WITH DIFFERENTIAL     Status: Abnormal   Collection Time: 02/08/15  4:19 PM  Result Value Ref Range   WBC 21.5 (H) 4.0 - 10.5 K/uL   RBC 3.89 (L) 4.22 - 5.81 MIL/uL   Hemoglobin 10.7 (L) 13.0 - 17.0 g/dL   HCT 16.1 (L) 09.6 - 04.5 %   MCV 88.2 78.0 - 100.0 fL   MCH 27.5 26.0 - 34.0 pg   MCHC 31.2 30.0 - 36.0 g/dL   RDW 40.9 (H) 81.1 - 91.4 %   Platelets 388 150 - 400 K/uL   Neutrophils Relative % 66 %   Lymphocytes Relative 21 %   Monocytes Relative 6 %   Eosinophils Relative 6 %   Basophils Relative 1 %   Neutro Abs 14.2 (H) 1.7 - 7.7 K/uL   Lymphs Abs 4.5 (H) 0.7 - 4.0 K/uL   Monocytes Absolute 1.3 (H) 0.1 - 1.0 K/uL   Eosinophils Absolute 1.3 (H) 0.0 - 0.7 K/uL   Basophils Absolute 0.2 (H) 0.0 - 0.1 K/uL   Smear Review MORPHOLOGY UNREMARKABLE   I-Stat CG4 Lactic Acid, ED  (not at  [facility])     Status: Abnormal   Collection Time: 02/08/15  4:28 PM  Result Value Ref Range   Lactic Acid, Venous 5.22 (HH) 0.5 - 2.0 mmol/L  Urinalysis, Routine w reflex microscopic (not at [facility])     Status: Abnormal   Collection Time: 02/08/15  5:51 PM  Result Value Ref Range   Color, Urine AMBER (A) YELLOW   APPearance CLOUDY (A) CLEAR   Specific Gravity, Urine 1.019 1.005 - 1.030   pH 6.0  5.0 - 8.0   Glucose, UA NEGATIVE NEGATIVE mg/dL   Hgb urine dipstick MODERATE (A) NEGATIVE   Bilirubin Urine NEGATIVE NEGATIVE   Ketones, ur NEGATIVE NEGATIVE mg/dL   Protein, ur 30 (A) NEGATIVE mg/dL   Nitrite POSITIVE (A) NEGATIVE   Leukocytes, UA LARGE (A) NEGATIVE  Urine microscopic-add on     Status: Abnormal   Collection Time: 02/08/15  5:51 PM  Result Value Ref Range   Squamous Epithelial / LPF NONE SEEN NONE SEEN   WBC, UA TOO NUMEROUS TO COUNT 0 - 5 WBC/hpf   RBC / HPF 6-30 0 - 5 RBC/hpf   Bacteria, UA MANY (A) NONE SEEN  I-Stat CG4 Lactic Acid, ED  (not at  [facility])     Status: Abnormal   Collection Time: 02/08/15  6:33 PM  Result Value Ref Range   Lactic Acid, Venous 2.81 (HH) 0.5 - 2.0 mmol/L   Comment NOTIFIED PHYSICIAN     UA evidence of UTI  No results found for: HGBA1C  Estimated Creatinine Clearance: 43.8 mL/min (by C-G formula based on Cr of 1.55).  BNP (last 3 results) No results for input(s): PROBNP in the last 8760 hours.  Other results:    Filed Weights   02/08/15 1619  Weight: 70.308 kg (155 lb)     Cultures: No results found for: SDES, SPECREQUEST, CULT, REPTSTATUS   Radiological Exams on Admission: Dg Chest Port 1 View  02/08/2015  CLINICAL DATA:  64 year old male with fever  EXAM: PORTABLE CHEST 1 VIEW COMPARISON:  Right knee radiographs obtained concurrently FINDINGS: Heart is upper limits of normal for size. Mediastinal contours are within normal limits. Atherosclerotic calcifications are present in the transverse aorta. The lungs are clear. No pleural effusion or pneumothorax. No acute osseous abnormality. IMPRESSION: 1. Borderline cardiomegaly. 2. Aortic atherosclerosis. 3. No acute cardiopulmonary process. Electronically Signed   By: Malachy Moan M.D.   On: 02/08/2015 17:50   Dg Knee Right Port  02/08/2015  CLINICAL DATA:  64 year old male with 1 day history of right knee pain and low-grade fever EXAM: PORTABLE RIGHT KNEE - 1-2 VIEW  COMPARISON:  Prior radiographs of the lower leg including the ankle 09/27/2010 FINDINGS: Positive for small ankle joint effusion. No evidence of acute fracture or malalignment. Normal bony mineralization. No lytic or blastic osseous lesion. No periosteal reaction. Mild tricompartmental degenerative change. IMPRESSION: 1. Positive for small suprapatellar knee joint effusion. Septic arthritis should be considered within the differential given the clinical history of fever. Additionally, degenerative and inflammatory effusions are considerations. 2. Mild tricompartmental osteoarthritis. Electronically Signed   By: Malachy Moan M.D.   On: 02/08/2015 17:52    Chart has been reviewed  Family not at  Bedside    Assessment/Plan  64 year-old gentleman history of psoriasis on intermittent prednisone admitted for sepsis evidence of cellulitis and possible septic knee as well as UTI   Present on Admission:  . Sepsis (HCC) admit to step down administer aggressive IV fluid resuscitation and sepsis protocol and await results of blood and urine culture treated broad-spectrum antibiotics vancomycin and Zosyn appreciated [facility] M consult, check procalcitonin  Knee effusion  - unclear for reactive versus septic joint. Will cover with broad-spectrum antibiotics. Cultures have been sent. Discussed,at length with orthopedics will make nothing by mouth for possible arthroscopic washout in the morning when patient is more stable  . Acute renal failure (ARF) (HCC) rehydrate, obtain urine electrolytes  . Anemia - will obtain anemia panel and Hemoccult stool unsure if this is chronic or acute given diffuse cutaneous changes workup for malignancy should be done once patient is stable  . Cellulitis diffuse secondary to excoriations broad-spectrum antibiotics given severity of illness   rash - etiology unclear. dermatology input would be important as well as biopsy, given diffuse involvement and clinical features would  consider Mycosis fungoides is a possibility Diarrhea  - Will obtain stool cultures,  rehydrate duration has been only 24 hours if persists would obtain further workup no recent antibiotics use  . UTI (lower urinary tract infection)  -cover with zosin results of urine culture pending    Prophylaxis:   Lovenox   CODE STATUS:  FULL CODE  as per patient   Disposition: To home once workup is complete and patient is stable  Other plan as per orders.  I have spent a total of 75 min on this admission cases been discussed with orthopedics and PcCM  Charles [name] 02/08/2015, 7:38 PM  [facility]  Pager [phone]   after 2 AM please page floor coverage PA If 7AM-7PM, please contact the day team taking care of the patient  [email or web address]  Password [email or web address]

## 2015-02-08 NOTE — Progress Notes (Addendum)
Pt informs CM he has not seen a doctor in the last "six months" "had a dermatologist but he did not help"  " I need to be admitted to the hospital to find out what this is. I can't move." Pt noted with scaly dark areas all over his body, swollen right knee and green crust in his eyes Pt with difficulty opening his eyes Pt noted to be in pain all over  Cm discussed EDP will obtain labs, imaging to see if he qualifies for medicare guidelines for admission and share this with hospital MDs.  Discuss the importance of pcp and dermatologist for f/u care Pt agreed to allow Cm to assist with finding him f/u providers  Pt reports he is "originally from [place]" Lived in [place] for over "thirty years before coming here" dermatologist, Janalyn HarderStuart Tafeen seen in last 6 months  Pt confirms no pcp CM search for medicare accepting providers within zip code [phone]   Support system: Has brother and lives with jasmine, "girlfriend" She has called frequently per cna  Prefers appt in mornings  Cm spoke with staff at [facility], [place], [phone]  to schedule pt an appt  Obtained pt an appt on Wednesday 03/03/15 at 1130 but pt needs to arrive at 10 am because he is establishing as a new medicare pt with a $20 co pay, medicare card and all other IDs  Pt updated  Cm placed a copy of [facility] flyer with appt date, time in pt belonging bag and placed information in d/c f/u section

## 2015-02-08 NOTE — Progress Notes (Signed)
ANTIBIOTIC CONSULT NOTE - INITIAL  Pharmacy Consult for vancomycin and zoysn Indication: cellulitis  No Known Allergies  Patient Measurements: Weight: 155 lb (70.308 kg)   Vital Signs: Temp: 101.1 F (38.4 C) (11/21 1445) Temp Source: Rectal (11/21 1445) BP: 116/63 mmHg (11/21 1704) Pulse Rate: 117 (11/21 1703) Intake/Output from previous day:   Intake/Output from this shift:    Labs:  Recent Labs  02/08/15 1619  WBC 21.5*  HGB 10.7*  PLT 388   CrCl cannot be calculated (Patient has no serum creatinine result on file.). No results for input(s): VANCOTROUGH, VANCOPEAK, VANCORANDOM, GENTTROUGH, GENTPEAK, GENTRANDOM, TOBRATROUGH, TOBRAPEAK, TOBRARND, AMIKACINPEAK, AMIKACINTROU, AMIKACIN in the last 72 hours.   Microbiology: No results found for this or any previous visit (from the past 720 hour(s)).  Medical History: Past Medical History  Diagnosis Date  . Eczema     Assessment: 64 yo male presenting with fever, pain in his right knee. Pt reports he has been seen by a dermatologist and told he has eczema/psoriasis. Pt reports rash has been getting significantly worse. Pt reports pain in knee started yesterday after working.  Goal of Therapy:  Vancomycin trough level 10-15 mcg/ml  Plan:  Vancomycin 1gm IV x 1 in ED then vanc 1250mg  IV q24h Zosyn 3.375g IV Q8H infused over 4hrs. vanc trough at steady state as needed Follow up renal function, cultures, clinical course  Arley Phenixllen Jsean Taussig RPh 02/08/2015, 5:26 PM Pager [phone]

## 2015-02-08 NOTE — ED Provider Notes (Signed)
CSN: [id number]     Arrival date & time 02/08/15  1430 History   First MD Initiated Contact with Patient 02/08/15 1524     Chief Complaint  Patient presents with  . Knee Pain  . Fever     (Consider location/radiation/quality/duration/timing/severity/associated sxs/prior Treatment) Patient is a 64 y.o. male presenting with fever. The history is provided by the patient. No language interpreter was used.  Fever Max temp prior to arrival:  101 Temp source:  Oral Severity:  Severe Onset quality:  Gradual Duration:  1 day Timing:  Constant Progression:  Worsening Chronicity:  New Relieved by:  Nothing Worsened by:  Nothing tried Ineffective treatments:  None tried Associated symptoms: cough, headaches, myalgias and rash   Rash:    Location:  Full body   Quality: blistering and redness     Severity:  Severe   Duration:  12 months   Timing:  Constant   Progression:  Worsening Risk factors: no sick contacts   Pt reports he has a severe rash.   Pt reports he has a fever. Pt reports he has been coughing.  Pt complains of pain in his right knee.  Pt reports he has been seen by a dermatologist and told he has eczema/psoriasis.  Pt reports rash has been getting significantly worse.  Pt reports pain in knee started yesterday after working.  Past Medical History  Diagnosis Date  . Eczema    Past Surgical History  Procedure Laterality Date  . No past surgeries     No family history on file. Social History  Substance Use Topics  . Smoking status: Current Every Day Smoker -- 1.00 packs/day    Types: Cigarettes  . Smokeless tobacco: None  . Alcohol Use: Yes     Comment: Once a month.     Review of Systems  Constitutional: Positive for fever.  Respiratory: Positive for cough.   Genitourinary: Positive for scrotal swelling.  Musculoskeletal: Positive for myalgias and joint swelling.  Skin: Positive for rash.  Neurological: Positive for headaches.  All other systems reviewed and  are negative.     Allergies  Review of patient's allergies indicates no known allergies.  Home Medications   Prior to Admission medications   Medication Sig Start Date End Date Taking? Authorizing Provider  hydrOXYzine (ATARAX/VISTARIL) 25 MG tablet Take 1 tablet (25 mg total) by mouth every 8 (eight) hours as needed. Patient not taking: Reported on 02/08/2015 01/28/15   Ace Gins Sam, PA-C  predniSONE (DELTASONE) 20 MG tablet Take 3 tablets (60 mg total) by mouth daily. Patient not taking: Reported on 02/08/2015 01/28/15   Ace Gins Sam, PA-C  triamcinolone cream (KENALOG) 0.1 % Apply 1 application topically 2 (two) times daily. Patient not taking: Reported on 02/08/2015 01/28/15   Ace Gins Sam, PA-C   BP 104/57 mmHg  Pulse 112  Temp(Src) 101.1 F (38.4 C) (Rectal)  Resp 18  Wt 70.308 kg  SpO2 96% Physical Exam  Constitutional: He is oriented to person, place, and time. He appears well-developed and well-nourished.  HENT:  Head: Normocephalic.  Right Ear: External ear normal.  Left Ear: External ear normal.  Mouth/Throat: Oropharynx is clear and moist.  Eyes: Conjunctivae are normal. Pupils are equal, round, and reactive to light.  Neck: Normal range of motion. Neck supple.  Cardiovascular: Normal rate and normal heart sounds.   Pulmonary/Chest: Effort normal.  Abdominal: Soft.  Musculoskeletal: He exhibits tenderness.  Neurological: He is alert and oriented to person, place, and  time. He has normal reflexes.  Skin: Rash noted. There is erythema.  Psychiatric: He has a normal mood and affect.  Nursing note and vitals reviewed.   ED Course  Procedures (including critical care time) Labs Review Labs Reviewed  CBC WITH DIFFERENTIAL/PLATELET - Abnormal; Notable for the following:    WBC 21.5 (*)    RBC 3.89 (*)    Hemoglobin 10.7 (*)    HCT 34.3 (*)    RDW 18.1 (*)    All other components within normal limits  I-STAT CG4 LACTIC ACID, ED - Abnormal; Notable for the  following:    Lactic Acid, Venous 5.22 (*)    All other components within normal limits  CULTURE, BLOOD (ROUTINE X 2)  CULTURE, BLOOD (ROUTINE X 2)  URINE CULTURE  COMPREHENSIVE METABOLIC PANEL  URINALYSIS, ROUTINE W REFLEX MICROSCOPIC (NOT AT [facility])    Imaging Review Dg Chest Port 1 View  02/08/2015  CLINICAL DATA:  64 year old male with fever EXAM: PORTABLE CHEST 1 VIEW COMPARISON:  Right knee radiographs obtained concurrently FINDINGS: Heart is upper limits of normal for size. Mediastinal contours are within normal limits. Atherosclerotic calcifications are present in the transverse aorta. The lungs are clear. No pleural effusion or pneumothorax. No acute osseous abnormality. IMPRESSION: 1. Borderline cardiomegaly. 2. Aortic atherosclerosis. 3. No acute cardiopulmonary process. Electronically Signed   By: Malachy MoanHeath  McCullough M.D.   On: 02/08/2015 17:50   Dg Knee Right Port  02/08/2015  CLINICAL DATA:  64 year old male with 1 day history of right knee pain and low-grade fever EXAM: PORTABLE RIGHT KNEE - 1-2 VIEW COMPARISON:  Prior radiographs of the lower leg including the ankle 09/27/2010 FINDINGS: Positive for small ankle joint effusion. No evidence of acute fracture or malalignment. Normal bony mineralization. No lytic or blastic osseous lesion. No periosteal reaction. Mild tricompartmental degenerative change. IMPRESSION: 1. Positive for small suprapatellar knee joint effusion. Septic arthritis should be considered within the differential given the clinical history of fever. Additionally, degenerative and inflammatory effusions are considerations. 2. Mild tricompartmental osteoarthritis. Electronically Signed   By: Malachy MoanHeath  McCullough M.D.   On: 02/08/2015 17:52   I have personally reviewed and evaluated these images and lab results as part of my medical decision-making.   EKG Interpretation None     CRITICAL CARE Performed by: [facility],KAREN Total critical care time: 45 minutes Critical  care time was exclusive of separately billable procedures and treating other patients. Critical care was necessary to treat or prevent imminent or life-threatening deterioration. Critical care was time spent personally by me on the following activities: development of treatment plan with patient and/or surrogate as well as nursing, discussions with consultants, evaluation of patient's response to treatment, examination of patient, obtaining history from patient or surrogate, ordering and performing treatments and interventions, ordering and review of laboratory studies, ordering and review of radiographic studies, pulse oximetry and re-evaluation of patient's condition.   MDM  Dr. Madilyn Hookees in to see.  Septic protocol began. Pt has wbc's of 21.5 and lactic acid of 5.22.    I spoke to Dr. Delton CoombesByrum who agrees with treatment plan.  He advised if Lactic acid decreases pt may be admitted to hospitalist.   Pt given IV fluids per protocol.  Pt given vancomycin and zosyn.  Pt had decreased lactic acid to 2.81.     I spoke to Dr. Ranell PatrickNorris Orthopaedist who will see pt here and aspirate knee.  I spoke to Dr. Kara Paceruotova who will see here and evaluate for admission.  Final diagnoses:  Sepsis affecting skin  UTI (lower urinary tract infection)  Cellulitis of knee  Psoriasis        Elson Areas, PA-C 02/08/15 2002  Tilden Fossa, MD 02/10/15 0006

## 2015-02-08 NOTE — ED Notes (Signed)
Stat gram stain results called to Dr. Ranell PatrickNorris

## 2015-02-08 NOTE — ED Notes (Signed)
Pts belongs in 2 bags labeled in room

## 2015-02-09 ENCOUNTER — Inpatient Hospital Stay (HOSPITAL_COMMUNITY): Payer: Medicare Other

## 2015-02-09 DIAGNOSIS — R7881 Bacteremia: Secondary | ICD-10-CM

## 2015-02-09 LAB — COMPREHENSIVE METABOLIC PANEL
ALK PHOS: 89 U/L (ref 38–126)
ALT: 22 U/L (ref 17–63)
ANION GAP: 7 (ref 5–15)
AST: 40 U/L (ref 15–41)
Albumin: 2 g/dL — ABNORMAL LOW (ref 3.5–5.0)
BILIRUBIN TOTAL: 1.2 mg/dL (ref 0.3–1.2)
BUN: 25 mg/dL — ABNORMAL HIGH (ref 6–20)
CALCIUM: 7.4 mg/dL — AB (ref 8.9–10.3)
CO2: 22 mmol/L (ref 22–32)
CREATININE: 1.75 mg/dL — AB (ref 0.61–1.24)
Chloride: 112 mmol/L — ABNORMAL HIGH (ref 101–111)
GFR, EST AFRICAN AMERICAN: 45 mL/min — AB (ref >60)
GFR, EST NON AFRICAN AMERICAN: 39 mL/min — AB (ref >60)
Glucose, Bld: 86 mg/dL (ref 65–99)
Potassium: 4.3 mmol/L (ref 3.5–5.1)
SODIUM: 141 mmol/L (ref 135–145)
TOTAL PROTEIN: 5.5 g/dL — AB (ref 6.5–8.1)

## 2015-02-09 LAB — CBC WITH DIFFERENTIAL/PLATELET
BASOS PCT: 1 %
Basophils Absolute: 0.2 10*3/uL — ABNORMAL HIGH (ref 0.0–0.1)
EOS ABS: 3.2 10*3/uL — AB (ref 0.0–0.7)
EOS PCT: 15 %
HCT: 29.9 % — ABNORMAL LOW (ref 39.0–52.0)
HEMOGLOBIN: 9.5 g/dL — AB (ref 13.0–17.0)
LYMPHS PCT: 19 %
Lymphs Abs: 4.1 10*3/uL — ABNORMAL HIGH (ref 0.7–4.0)
MCH: 27.3 pg (ref 26.0–34.0)
MCHC: 31.8 g/dL (ref 30.0–36.0)
MCV: 85.9 fL (ref 78.0–100.0)
Monocytes Absolute: 1.5 10*3/uL — ABNORMAL HIGH (ref 0.1–1.0)
Monocytes Relative: 7 %
NEUTROS ABS: 12.4 10*3/uL — AB (ref 1.7–7.7)
Neutrophils Relative %: 58 %
Platelets: 373 10*3/uL (ref 150–400)
RBC: 3.48 MIL/uL — ABNORMAL LOW (ref 4.22–5.81)
RDW: 18 % — ABNORMAL HIGH (ref 11.5–15.5)
WBC: 21.4 10*3/uL — ABNORMAL HIGH (ref 4.0–10.5)

## 2015-02-09 LAB — APTT: APTT: 30 s (ref 24–37)

## 2015-02-09 LAB — VITAMIN B12: Vitamin B-12: 374 pg/mL (ref 180–914)

## 2015-02-09 LAB — PHOSPHORUS: Phosphorus: 3.9 mg/dL (ref 2.5–4.6)

## 2015-02-09 LAB — IRON AND TIBC
Iron: 12 ug/dL — ABNORMAL LOW (ref 45–182)
SATURATION RATIOS: 8 % — AB (ref 17.9–39.5)
TIBC: 158 ug/dL — AB (ref 250–450)
UIBC: 146 ug/dL

## 2015-02-09 LAB — PROTIME-INR
INR: 1.55 — AB (ref 0.00–1.49)
INR: 1.63 — AB (ref 0.00–1.49)
PROTHROMBIN TIME: 19.3 s — AB (ref 11.6–15.2)
Prothrombin Time: 18.6 seconds — ABNORMAL HIGH (ref 11.6–15.2)

## 2015-02-09 LAB — MRSA PCR SCREENING: MRSA BY PCR: POSITIVE — AB

## 2015-02-09 LAB — RETICULOCYTES
RBC.: 3.48 MIL/uL — AB (ref 4.22–5.81)
RETIC CT PCT: 3.9 % — AB (ref 0.4–3.1)
Retic Count, Absolute: 135.7 10*3/uL (ref 19.0–186.0)

## 2015-02-09 LAB — FERRITIN: Ferritin: 80 ng/mL (ref 24–336)

## 2015-02-09 LAB — TSH: TSH: 2.273 u[IU]/mL (ref 0.350–4.500)

## 2015-02-09 LAB — PROCALCITONIN: PROCALCITONIN: 2.65 ng/mL

## 2015-02-09 LAB — CREATININE, URINE, RANDOM: CREATININE, URINE: 78.18 mg/dL

## 2015-02-09 LAB — FOLATE: FOLATE: 10.2 ng/mL (ref >5.9)

## 2015-02-09 LAB — ABO/RH: ABO/RH(D): B POS

## 2015-02-09 LAB — OCCULT BLOOD X 1 CARD TO LAB, STOOL: FECAL OCCULT BLD: NEGATIVE

## 2015-02-09 LAB — LACTIC ACID, PLASMA
LACTIC ACID, VENOUS: 2.5 mmol/L — AB (ref 0.5–2.0)
LACTIC ACID, VENOUS: 2.6 mmol/L — AB (ref 0.5–2.0)

## 2015-02-09 LAB — TYPE AND SCREEN
ABO/RH(D): B POS
Antibody Screen: NEGATIVE

## 2015-02-09 LAB — MAGNESIUM: Magnesium: 2 mg/dL (ref 1.7–2.4)

## 2015-02-09 LAB — SODIUM, URINE, RANDOM: Sodium, Ur: 31 mmol/L

## 2015-02-09 MED ORDER — DIPHENHYDRAMINE HCL 25 MG PO CAPS
25.0000 mg | ORAL_CAPSULE | ORAL | Status: DC | PRN
  Administered 2015-02-09 (×2): 25 mg via ORAL
  Administered 2015-02-09 – 2015-02-10 (×5): 50 mg via ORAL
  Administered 2015-02-11 – 2015-02-12 (×3): 25 mg via ORAL
  Administered 2015-02-12 – 2015-02-15 (×13): 50 mg via ORAL
  Administered 2015-02-15: 25 mg via ORAL
  Administered 2015-02-16 – 2015-02-18 (×5): 50 mg via ORAL
  Administered 2015-02-18: 25 mg via ORAL
  Administered 2015-02-18 – 2015-02-21 (×3): 50 mg via ORAL
  Filled 2015-02-09 (×13): qty 2
  Filled 2015-02-09: qty 1
  Filled 2015-02-09 (×4): qty 2
  Filled 2015-02-09: qty 1
  Filled 2015-02-09: qty 2
  Filled 2015-02-09: qty 1
  Filled 2015-02-09 (×3): qty 2
  Filled 2015-02-09: qty 1
  Filled 2015-02-09 (×5): qty 2
  Filled 2015-02-09: qty 1
  Filled 2015-02-09 (×3): qty 2
  Filled 2015-02-09: qty 1

## 2015-02-09 MED ORDER — SODIUM CHLORIDE 0.9 % IV BOLUS (SEPSIS)
500.0000 mL | Freq: Once | INTRAVENOUS | Status: AC
  Administered 2015-02-09: 500 mL via INTRAVENOUS

## 2015-02-09 MED ORDER — VANCOMYCIN HCL IN DEXTROSE 1-5 GM/200ML-% IV SOLN
1000.0000 mg | INTRAVENOUS | Status: DC
  Administered 2015-02-09: 1000 mg via INTRAVENOUS
  Filled 2015-02-09: qty 200

## 2015-02-09 MED ORDER — SODIUM CHLORIDE 0.9 % IV SOLN
INTRAVENOUS | Status: DC
  Administered 2015-02-09: 23:00:00 via INTRAVENOUS

## 2015-02-09 MED ORDER — HYDROCERIN EX CREA
TOPICAL_CREAM | Freq: Two times a day (BID) | CUTANEOUS | Status: DC
  Administered 2015-02-09 – 2015-02-11 (×5): via TOPICAL
  Administered 2015-02-11: 1 via TOPICAL
  Administered 2015-02-12 – 2015-02-18 (×6): via TOPICAL
  Administered 2015-02-18: 1 via TOPICAL
  Administered 2015-02-18 – 2015-02-20 (×3): via TOPICAL
  Filled 2015-02-09 (×5): qty 113

## 2015-02-09 MED ORDER — ENSURE ENLIVE PO LIQD
237.0000 mL | Freq: Two times a day (BID) | ORAL | Status: DC
  Administered 2015-02-09 – 2015-02-21 (×18): 237 mL via ORAL

## 2015-02-09 MED ORDER — MUPIROCIN 2 % EX OINT
1.0000 "application " | TOPICAL_OINTMENT | Freq: Two times a day (BID) | CUTANEOUS | Status: AC
  Administered 2015-02-09 – 2015-02-13 (×10): 1 via NASAL
  Filled 2015-02-09: qty 22

## 2015-02-09 MED ORDER — CHLORHEXIDINE GLUCONATE CLOTH 2 % EX PADS
6.0000 | MEDICATED_PAD | Freq: Every day | CUTANEOUS | Status: AC
  Administered 2015-02-09 – 2015-02-13 (×5): 6 via TOPICAL

## 2015-02-09 NOTE — Care Management Note (Signed)
Case Management Note  Patient Details  Name: Charles [name] MRN: [id number] Date of Birth: [birth year]  Subjective/Objective:           Sepsis of a major joint          Action/Plan:Date: February 09, 2015 Chart reviewed for concurrent status and case management needs. Will continue to follow patient for changes and needs: Marcelle Smilinghonda Rafael Salway, RN, BSN, [place]   [phone]  Expected Discharge Date:   (unknown)               Expected Discharge Plan:  Home/Self Care  In-House Referral:  NA  Discharge planning Services  CM Consult  Post Acute Care Choice:  NA Choice offered to:  NA  DME Arranged:    DME Agency:     HH Arranged:    HH Agency:     Status of Service:  In process, will continue to follow  Medicare Important Message Given:    Date Medicare IM Given:    Medicare IM give by:    Date Additional Medicare IM Given:    Additional Medicare Important Message give by:     If discussed at Long Length of Stay Meetings, dates discussed:    Additional Comments:  Golda AcreDavis, Davinci Glotfelty Lynn, RN 02/09/2015, 8:51 AM

## 2015-02-09 NOTE — Progress Notes (Signed)
[facility]                                                                              Patient Demographics  Charles [name], is a 64 y.o. male, DOB - 1950/07/03, ZOX:[id number]  Admit date - 02/08/2015   Admitting Physician Therisa Doyne, MD  Outpatient Primary MD for the patient is Geraldo Pitter, MD  LOS - 1   Chief Complaint  Patient presents with  . Knee Pain  . Fever      Interim history 64 year old with history of supposed eczema presented with right knee pain.  Denies any injury.  Patient was noted to be septic upon admission, and started on zosyn and vanc.  Ortho consulted.   Assessment & Plan   Sepsis secondary to urinary tract infection with hematuria/possible bacteremia -Upon admission, patient was febrile with tachycardia and tachypnea with leukocytosis -Lactic acid improving, 2.6 -UA: Positive nitrites, large leukocytes, TNTC WBC, many bacteria -Urine culture pending -Blood culture: 1/2 GPC -Echocardiogram pending -Will obtain repeat Blood cultures -Continue IVF, vancomycin, Zosyn  Acute kidney injury  -unknown baseline -Cr 1.75 -have placed call to PCP, left message to obtain past labs  Right knee pain -Given the patient was septic, orthopedics was consulted and appreciated -Patient status post right knee aspiration which showed 66,000 WBC -No growth to date -Suspect this could be reactive inflammatory type of infusion -Will consult PT and OT  ?Psoriasis  -Patient needs to follow up dermatology as an outpatient -patient has scaly skin, with multiple excoriations   Sinus tachycardia -Likely secondary to sepsis -TSH 2.273, magnesium 2  Transient hypotension -resolved with IVF -Likely secondary to sepsis  Normocytic anemia  -Unknown baseline hemoglobin, currently 9.5 -Continue to monitor CBC -Anemia panel shows iron of 12, ferritin of 80 -Will order dose of Feraheme  Code Status: Full  Family Communication: None at  bedside.  Disposition Plan: Admitted. Continue to monitor in stepdown  Time Spent in minutes   30 minutes  Procedures  Right knee aspiration   Consults   orthopedics   DVT Prophylaxis  SCDs  Lab Results  Component Value Date   PLT 373 02/09/2015    Medications  Scheduled Meds: . Chlorhexidine Gluconate Cloth  6 each Topical Q0600  . feeding supplement (ENSURE ENLIVE)  237 mL Oral BID BM  . hydrocerin   Topical BID  . mupirocin ointment  1 application Nasal BID  . piperacillin-tazobactam (ZOSYN)  IV  3.375 g Intravenous Q8H  . sodium chloride  3 mL Intravenous Q12H  . vancomycin  1,000 mg Intravenous Q24H   Continuous Infusions: . sodium chloride 100 mL/hr at 02/09/15 0741   PRN Meds:.acetaminophen **OR** acetaminophen, diphenhydrAMINE, morphine injection, ondansetron **OR** ondansetron (ZOFRAN) IV  Antibiotics    Anti-infectives    Start     Dose/Rate Route Frequency Ordered Stop   02/09/15 1600  vancomycin (VANCOCIN) 1,250 mg in sodium chloride 0.9 % 250 mL IVPB  Status:  Discontinued     1,250 mg 166.7 mL/hr over 90 Minutes Intravenous Every 24 hours 02/08/15 1725 02/09/15 0743   02/09/15 1600  vancomycin (VANCOCIN) IVPB 1000 mg/200 mL premix     1,000 mg  200 mL/hr over 60 Minutes Intravenous Every 24 hours 02/09/15 0743     02/09/15 0000  piperacillin-tazobactam (ZOSYN) IVPB 3.375 g     3.375 g 12.5 mL/hr over 240 Minutes Intravenous Every 8 hours 02/08/15 1725     02/08/15 1600  vancomycin (VANCOCIN) IVPB 1000 mg/200 mL premix     1,000 mg 200 mL/hr over 60 Minutes Intravenous  Once 02/08/15 1548 02/08/15 1802   02/08/15 1600  piperacillin-tazobactam (ZOSYN) IVPB 3.375 g     3.375 g 100 mL/hr over 30 Minutes Intravenous  Once 02/08/15 1548 02/08/15 1701      Subjective:   Charles [name] seen and examined today. Patient denies any pain at this time. Denies chest pain, shortness breath, abdominal pain. Denies any nausea or vomiting. Supposedly had diarrhea  before coming in however has not had any further episodes.  Objective:   Filed Vitals:   02/09/15 0700 02/09/15 0800 02/09/15 0900 02/09/15 1200  BP: 124/71 126/73 117/74   Pulse: 113 125 132   Temp:  97.8 F (36.6 C)  97.6 F (36.4 C)  TempSrc:  Oral  Oral  Resp: 22 21    Height:      Weight:      SpO2: 99% 100% 98%     Wt Readings from Last 3 Encounters:  02/08/15 77.9 kg (171 lb 11.8 oz)  01/28/15 70.308 kg (155 lb)  09/14/14 73.5 kg (162 lb 0.6 oz)     Intake/Output Summary (Last 24 hours) at 02/09/15 1325 Last data filed at 02/09/15 1100  Gross per 24 hour  Intake 5020.75 ml  Output      0 ml  Net 5020.75 ml    Exam  General: Well developed, well nourished, NAD, appears stated age  HEENT: NCAT, mucous membranes moist.   Cardiovascular: S1 S2 auscultated, no rubs, murmurs or gallops. Tachycardic  Respiratory: Clear to auscultation bilaterally with equal chest rise  Abdomen: Soft, nontender, nondistended, + bowel sounds  Extremities: warm dry without cyanosis clubbing or edema  Neuro: AAOx3, nonfocal  Skin: Multiple excoriations, scaly skin  Psych: Normal affect and demeanor with intact judgement and insight  Data Review   Micro Results Recent Results (from the past 240 hour(s))  Blood Culture (routine x 2)     Status: None (Preliminary result)   Collection Time: 02/08/15  2:23 PM  Result Value Ref Range Status   Specimen Description BLOOD RIGHT ARM  Final   Special Requests IN PEDIATRIC BOTTLE 3CC  Final   Culture   Final    NO GROWTH < 24 HOURS Performed at [facility]    Report Status PENDING  Incomplete  Blood Culture (routine x 2)     Status: None (Preliminary result)   Collection Time: 02/08/15  2:31 PM  Result Value Ref Range Status   Specimen Description RIGHT ANTECUBITAL  Final   Special Requests IN PEDIATRIC BOTTLE 3CC  Final   Culture  Setup Time   Final    GRAM POSITIVE COCCI IN CLUSTERS GRAM POSITIVE COCCI IN  CHAINS AEROBIC BOTTLE ONLY CRITICAL RESULT CALLED TO, READ BACK BY AND VERIFIED WITH: SCOTT @0715  02/09/15 MKELLY     Culture   Final    NO GROWTH < 24 HOURS Performed at [facility]    Report Status PENDING  Incomplete  Urine culture     Status: None (Preliminary result)   Collection Time: 02/08/15  5:52 PM  Result Value Ref Range Status   Specimen Description  URINE, CLEAN CATCH  Final   Special Requests NONE  Final   Culture   Final    TOO YOUNG TO READ Performed at [facility]    Report Status PENDING  Incomplete  Body fluid culture     Status: None (Preliminary result)   Collection Time: 02/08/15  7:42 PM  Result Value Ref Range Status   Specimen Description SYNOVIAL R KNEE  Final   Special Requests Normal  Final   Gram Stain   Final    ABUNDANT WBC PRESENT, PREDOMINANTLY PMN NO ORGANISMS SEEN Gram Stain Report Called to,Read Back By and Verified With: Ulyses Amor [id number] @ 2126 BY J SCOTTON    Culture PENDING  Incomplete   Report Status PENDING  Incomplete  MRSA PCR Screening     Status: Abnormal   Collection Time: 02/08/15 10:41 PM  Result Value Ref Range Status   MRSA by PCR POSITIVE (A) NEGATIVE Final    Comment:        The GeneXpert MRSA Assay (FDA approved for NASAL specimens only), is one component of a comprehensive MRSA colonization surveillance program. It is not intended to diagnose MRSA infection nor to guide or monitor treatment for MRSA infections. RESULT CALLED TO, READ BACK BY AND VERIFIED WITH: CROFTS,S RN [phone] [facility]     Radiology Reports Dg Chest Port 1 View  02/08/2015  CLINICAL DATA:  64 year old male with fever EXAM: PORTABLE CHEST 1 VIEW COMPARISON:  Right knee radiographs obtained concurrently FINDINGS: Heart is upper limits of normal for size. Mediastinal contours are within normal limits. Atherosclerotic calcifications are present in the transverse aorta. The lungs are clear. No pleural effusion or  pneumothorax. No acute osseous abnormality. IMPRESSION: 1. Borderline cardiomegaly. 2. Aortic atherosclerosis. 3. No acute cardiopulmonary process. Electronically Signed   By: Malachy Moan M.D.   On: 02/08/2015 17:50   Dg Knee Right Port  02/08/2015  CLINICAL DATA:  64 year old male with 1 day history of right knee pain and low-grade fever EXAM: PORTABLE RIGHT KNEE - 1-2 VIEW COMPARISON:  Prior radiographs of the lower leg including the ankle 09/27/2010 FINDINGS: Positive for small ankle joint effusion. No evidence of acute fracture or malalignment. Normal bony mineralization. No lytic or blastic osseous lesion. No periosteal reaction. Mild tricompartmental degenerative change. IMPRESSION: 1. Positive for small suprapatellar knee joint effusion. Septic arthritis should be considered within the differential given the clinical history of fever. Additionally, degenerative and inflammatory effusions are considerations. 2. Mild tricompartmental osteoarthritis. Electronically Signed   By: Malachy Moan M.D.   On: 02/08/2015 17:52    CBC  Recent Labs Lab 02/08/15 1619 02/09/15 0200  WBC 21.5* 21.4*  HGB 10.7* 9.5*  HCT 34.3* 29.9*  PLT 388 373  MCV 88.2 85.9  MCH 27.5 27.3  MCHC 31.2 31.8  RDW 18.1* 18.0*  LYMPHSABS 4.5* 4.1*  MONOABS 1.3* 1.5*  EOSABS 1.3* 3.2*  BASOSABS 0.2* 0.2*    Chemistries   Recent Labs Lab 02/08/15 1619 02/09/15 0200  NA 137 141  K 4.6 4.3  CL 106 112*  CO2 22 22  GLUCOSE 89 86  BUN 22* 25*  CREATININE 1.55* 1.75*  CALCIUM 7.6* 7.4*  MG  --  2.0  AST 30 40  ALT 21 22  ALKPHOS 101 89  BILITOT 1.5* 1.2   ------------------------------------------------------------------------------------------------------------------ estimated creatinine clearance is 38.8 mL/min (by C-G formula based on Cr of 1.75). ------------------------------------------------------------------------------------------------------------------ No results for input(s):  HGBA1C in the last 72 hours. ------------------------------------------------------------------------------------------------------------------ No  results for input(s): CHOL, HDL, LDLCALC, TRIG, CHOLHDL, LDLDIRECT in the last 72 hours. ------------------------------------------------------------------------------------------------------------------  Recent Labs  02/09/15 0200  TSH 2.273   ------------------------------------------------------------------------------------------------------------------  Recent Labs  02/09/15 0200 02/09/15 0205  VITAMINB12  --  374  FOLATE 10.2  --   FERRITIN  --  80  TIBC  --  158*  IRON  --  12*  RETICCTPCT 3.9*  --     Coagulation profile  Recent Labs Lab 02/09/15 0002  INR 1.63*    No results for input(s): DDIMER in the last 72 hours.  Cardiac Enzymes No results for input(s): CKMB, TROPONINI, MYOGLOBIN in the last 168 hours.  Invalid input(s): CK ------------------------------------------------------------------------------------------------------------------ Invalid input(s): POCBNP    Charles [name] D.O. on 02/09/2015 at 1:25 PM  Between 7am to 7pm - Pager - [phone]  After 7pm go to [email or web address] - password [email or web address]  And look for the night coverage person covering for me after hours  [facility] Office  [phone]

## 2015-02-09 NOTE — Progress Notes (Signed)
ANTIBIOTIC CONSULT NOTE - Follow Up  Pharmacy Consult for vancomycin and zoysn Indication: cellulitis, rule out sepsis  No Known Allergies  Patient Measurements: Height: 5\' 7"  (170.2 cm) Weight: 171 lb 11.8 oz (77.9 kg) IBW/kg (Calculated) : 66.1   Vital Signs: Temp: 97.9 F (36.6 C) (11/22 0506) Temp Source: Oral (11/22 0506) BP: 128/56 mmHg (11/22 0500) Pulse Rate: 117 (11/22 0500) Intake/Output from previous day: 11/21 0701 - 11/22 0700 In: 4300.7 [I.V.:3250.7; IV Piggyback:1050] Out: -  Intake/Output from this shift:    Labs:  Recent Labs  02/08/15 1619 02/09/15 0200  WBC 21.5* 21.4*  HGB 10.7* 9.5*  PLT 388 373  CREATININE 1.55* 1.75*   Estimated Creatinine Clearance: 38.8 mL/min (by C-G formula based on Cr of 1.75). No results for input(s): VANCOTROUGH, VANCOPEAK, VANCORANDOM, GENTTROUGH, GENTPEAK, GENTRANDOM, TOBRATROUGH, TOBRAPEAK, TOBRARND, AMIKACINPEAK, AMIKACINTROU, AMIKACIN in the last 72 hours.   Microbiology: Recent Results (from the past 720 hour(s))  Blood Culture (routine x 2)     Status: None (Preliminary result)   Collection Time: 02/08/15  2:31 PM  Result Value Ref Range Status   Specimen Description RIGHT ANTECUBITAL  Final   Special Requests IN PEDIATRIC BOTTLE 3CC  Final   Culture  Setup Time   Final    GRAM POSITIVE COCCI IN CLUSTERS GRAM POSITIVE COCCI IN CHAINS AEROBIC BOTTLE ONLY CRITICAL RESULT CALLED TO, READ BACK BY AND VERIFIED WITH: SCOTT @0715  02/09/15 MKELLY  [facility]    Culture PENDING  Incomplete   Report Status PENDING  Incomplete  Body fluid culture     Status: None (Preliminary result)   Collection Time: 02/08/15  7:42 PM  Result Value Ref Range Status   Specimen Description SYNOVIAL R KNEE  Final   Special Requests Normal  Final   Gram Stain   Final    ABUNDANT WBC PRESENT, PREDOMINANTLY PMN NO ORGANISMS SEEN Gram Stain Report Called to,Read Back By and Verified With: Ulyses AmorERRI DOSTER,RN [id number]  @ 2126 BY J SCOTTON    Culture PENDING  Incomplete   Report Status PENDING  Incomplete  MRSA PCR Screening     Status: Abnormal   Collection Time: 02/08/15 10:41 PM  Result Value Ref Range Status   MRSA by PCR POSITIVE (A) NEGATIVE Final    Comment:        The GeneXpert MRSA Assay (FDA approved for NASAL specimens only), is one component of a comprehensive MRSA colonization surveillance program. It is not intended to diagnose MRSA infection nor to guide or monitor treatment for MRSA infections. RESULT CALLED TO, READ BACK BY AND VERIFIED WITH: CROFTS,S RN [phone] [id number] COVINGTON,N     Medical History: Past Medical History  Diagnosis Date  . Eczema     Assessment: 64 yo male presenting with fever, pain in his right knee. Pt reports he has been seen by a dermatologist and told he has eczema/psoriasis. Pt reports rash has been getting significantly worse. Pt reports pain in knee started yesterday after working.  11/21 >> Vancomycin >> 11/21 >> Zosyn >>  11/21 blood: 1 of 2 GPC in chains and clusters 11/21 urine: sent 11/21 MRSA PCR: positive 11/21 R knee: abundant WBC, no organisms seen  Tmax: 101.1 WBC: 21.4 Renal: SCr increased 1.75, CrCl 38 ml/min  Goal of Therapy:  Vancomycin trough level 15-20 mcg/ml Zosyn dose appropriate for renal function  Plan:   Decrease vancomycin to 1gm IV q24h for rising SCr  Continue Zosyn 3.375g IV Q8H infused over  4hrs.  Check trough at steady state  Follow up renal function, cultures, clinical course  Loralee Pacas, PharmD, BCPS Pager: [phone]  02/09/2015, 7:37 AM

## 2015-02-09 NOTE — Progress Notes (Signed)
  Echocardiogram 2D Echocardiogram has been performed.  Leta JunglingCooper, Effie Janoski M 02/09/2015, 1:15 PM

## 2015-02-09 NOTE — Progress Notes (Signed)
Patient's wife Marcelino Freestoneamela Wickwire called and wants the Social Worker to contact her with disposition. Phone Number is [phone].

## 2015-02-09 NOTE — Progress Notes (Signed)
Orthopedics Progress Note  Subjective: Patient reports that the knee is some better this morning  Objective:  Filed Vitals:   02/09/15 0500 02/09/15 0506  BP: 128/56   Pulse: 117   Temp:  97.9 F (36.6 C)  Resp: 22     General: Awake and alert  Musculoskeletal: right knee swelling down some this morning versus last night Neurovascularly intact  Lab Results  Component Value Date   WBC 21.4* 02/09/2015   HGB 9.5* 02/09/2015   HCT 29.9* 02/09/2015   MCV 85.9 02/09/2015   PLT 373 02/09/2015       Component Value Date/Time   NA 141 02/09/2015 0200   K 4.3 02/09/2015 0200   CL 112* 02/09/2015 0200   CO2 22 02/09/2015 0200   GLUCOSE 86 02/09/2015 0200   BUN 25* 02/09/2015 0200   CREATININE 1.75* 02/09/2015 0200   CALCIUM 7.4* 02/09/2015 0200   GFRNONAA 39* 02/09/2015 0200   GFRAA 45* 02/09/2015 0200    Lab Results  Component Value Date   INR 1.63* 02/09/2015    Assessment/Plan:  s/p aspiration of right knee effusion. No bacteria seen on gram stain.  66K WBC mostly polys in the fluid. ? Reactive inflammatory effusion. No clear evidence for sepsis at this time although not ruled out.  Given he is still being resuscitated at this point recommend observation and continued IV antibiotics.  Ice to the knee and supportive care.  Ok to eat at this point per us.  Will follow cultures and clinical status. Bed rest with bathroom privileges.   Thanks!  Call with any questions. [phone]   Almedia BallsSteven R. Ranell PatrickNorris, MD 02/09/2015 6:33 AM

## 2015-02-09 NOTE — Consult Note (Signed)
WOC wound consult note Reason for Consult: Psoriasis, chronic.  No dermatology consult, it is questionable that patient has seen a dermatologist in the past.  [facility] and treated for eczema. Wound type: Chronic dermatologic condition Pressure Ulcer POA: No Measurement:scattered partial thickness tissue loss over body resultant from scratching, none are larger than 0.4cm round x 0.2cm. Skin is very dry, ashen in color. Healing linear scratches (excoriations) are apparent on LEs and UEs. Wound BJY:NWGNFbed:clean, pink, moist Drainage (amount, consistency, odor) none Periwound:see above.  Very dry. Dressing procedure/placement/frequency: The care of chronic dermatological conditions is somewhat outside of the scope of WOC nursing, but Dermatology Services are not available typically in acute care, so WOC was consulted.  I can provide Eucerin cream and outline the care of the skin in a conservative manner while patient is here in house. Orders are provided for Nursing for bathing and application of Eucerin to areas impacted by psoriasis. WOC nursing team will not follow, but will remain available to this patient, the nursing and medical teams.  Please re-consult if needed. Thanks, Ladona MowLaurie Camaria Gerald, MSN, RN, GNP, Hans EdenCWOCN, CWON-AP, FAAN  Pager# [phone]

## 2015-02-09 NOTE — Progress Notes (Signed)
Initial Nutrition Assessment  DOCUMENTATION CODES:   Not applicable  INTERVENTION:   No RD Interventions necessary at this time  Pt requested Ensure, will provide BID   NUTRITION DIAGNOSIS:    None at this time  GOAL:   Patient will meet greater than or equal to 90% of their needs   MONITOR:   PO intake, I & O's, Skin, Labs, Supplement acceptance  REASON FOR ASSESSMENT:   Malnutrition Screening Tool    ASSESSMENT:   64 year-old gentleman history of psoriasis on intermittent prednisone admitted for sepsis evidence of cellulitis and possible septic knee as well as UTI  Spoke with pt at bedside. Pt is a 2.0 identified by MST. Reports usual weight of 155, currently 172 likely due to generalized edema with AKI. Pt reports no nausea/vomitting/diarrhea/constipation, good appetite. Pt requested Ensure, drinks ensure at home..will provide during stay.  Diet Order:  Diet Heart Room service appropriate?: Yes; Fluid consistency:: Thin  Skin:  Wound (see comment) (Eczema)  Last BM:  PTA  Height:   Ht Readings from Last 1 Encounters:  02/08/15 5\' 7"  (1.702 m)    Weight:   Wt Readings from Last 1 Encounters:  02/08/15 171 lb 11.8 oz (77.9 kg)    Ideal Body Weight:  67.27 kg  BMI:  Body mass index is 26.89 kg/(m^2).  Estimated Nutritional Needs:   Kcal:  2000-2300  Protein:  75-90 grams  Fluid:  >/= 2L  EDUCATION NEEDS:   No education needs identified at this time  Charles AnoWilliam M. Latisa Belay, MS, RD LDN After Hours/Weekend Pager [phone]

## 2015-02-10 DIAGNOSIS — Z113 Encounter for screening for infections with a predominantly sexual mode of transmission: Secondary | ICD-10-CM | POA: Diagnosis present

## 2015-02-10 DIAGNOSIS — L539 Erythematous condition, unspecified: Secondary | ICD-10-CM | POA: Diagnosis present

## 2015-02-10 DIAGNOSIS — B9561 Methicillin susceptible Staphylococcus aureus infection as the cause of diseases classified elsewhere: Secondary | ICD-10-CM

## 2015-02-10 DIAGNOSIS — L309 Dermatitis, unspecified: Secondary | ICD-10-CM

## 2015-02-10 DIAGNOSIS — M00061 Staphylococcal arthritis, right knee: Secondary | ICD-10-CM | POA: Diagnosis present

## 2015-02-10 DIAGNOSIS — A4101 Sepsis due to Methicillin susceptible Staphylococcus aureus: Secondary | ICD-10-CM | POA: Diagnosis present

## 2015-02-10 LAB — CBC
HCT: 29.4 % — ABNORMAL LOW (ref 39.0–52.0)
HEMOGLOBIN: 9.5 g/dL — AB (ref 13.0–17.0)
MCH: 28.4 pg (ref 26.0–34.0)
MCHC: 32.3 g/dL (ref 30.0–36.0)
MCV: 87.8 fL (ref 78.0–100.0)
PLATELETS: 420 10*3/uL — AB (ref 150–400)
RBC: 3.35 MIL/uL — AB (ref 4.22–5.81)
RDW: 18.5 % — ABNORMAL HIGH (ref 11.5–15.5)
WBC: 22.1 10*3/uL — AB (ref 4.0–10.5)

## 2015-02-10 LAB — URINE CULTURE

## 2015-02-10 LAB — CULTURE, BLOOD (ROUTINE X 2)

## 2015-02-10 LAB — BASIC METABOLIC PANEL
ANION GAP: 6 (ref 5–15)
BUN: 30 mg/dL — AB (ref 6–20)
CHLORIDE: 115 mmol/L — AB (ref 101–111)
CO2: 22 mmol/L (ref 22–32)
Calcium: 7.3 mg/dL — ABNORMAL LOW (ref 8.9–10.3)
Creatinine, Ser: 1.57 mg/dL — ABNORMAL HIGH (ref 0.61–1.24)
GFR, EST AFRICAN AMERICAN: 51 mL/min — AB (ref >60)
GFR, EST NON AFRICAN AMERICAN: 44 mL/min — AB (ref >60)
Glucose, Bld: 100 mg/dL — ABNORMAL HIGH (ref 65–99)
POTASSIUM: 4.2 mmol/L (ref 3.5–5.1)
SODIUM: 143 mmol/L (ref 135–145)

## 2015-02-10 LAB — FECAL LACTOFERRIN, QUANT: Fecal Lactoferrin: POSITIVE

## 2015-02-10 LAB — LACTIC ACID, PLASMA: LACTIC ACID, VENOUS: 2.5 mmol/L — AB (ref 0.5–2.0)

## 2015-02-10 MED ORDER — SODIUM CHLORIDE 0.9 % IV SOLN
INTRAVENOUS | Status: DC
  Administered 2015-02-10 – 2015-02-14 (×7): via INTRAVENOUS

## 2015-02-10 MED ORDER — SODIUM CHLORIDE 0.9 % IV BOLUS (SEPSIS)
1000.0000 mL | Freq: Once | INTRAVENOUS | Status: AC
  Administered 2015-02-10: 1000 mL via INTRAVENOUS

## 2015-02-10 MED ORDER — DEXTROSE 5 % IV SOLN
2.0000 g | INTRAVENOUS | Status: DC
  Administered 2015-02-10 – 2015-02-11 (×2): 2 g via INTRAVENOUS
  Filled 2015-02-10 (×3): qty 2

## 2015-02-10 MED ORDER — VANCOMYCIN HCL IN DEXTROSE 750-5 MG/150ML-% IV SOLN
750.0000 mg | Freq: Two times a day (BID) | INTRAVENOUS | Status: DC
  Administered 2015-02-10 – 2015-02-13 (×6): 750 mg via INTRAVENOUS
  Filled 2015-02-10 (×8): qty 150

## 2015-02-10 NOTE — Progress Notes (Signed)
[facility] PROGRESS NOTE  Stoy Strouse DGU:[id number] DOB: 09-Feb-1951 DOA: 02/08/2015 PCP: Geraldo Pitter, MD Interim summary: 64 y.o. male with h/o eczema presented with 24 hours of right knee pain. He was found to be in sepsis from MRSA UTI and right knee infection. He was started on vancomycin and zosyn. Orthopedics consulted.   Assessment/Plan: Sepsis secondary to urinary tract infection with hematuria/possible bacteremia -Upon admission, patient was febrile with tachycardia and tachypnea with leukocytosis -Lactic acid improving,. -UA: Positive nitrites, large leukocytes, TNTC WBC, many bacteria -Urine culture shows MRSA sensitive to vancomyin.  -Blood culture:  GPC and rods, identification and sensitivities pending.  -Echocardiogram pending -Will obtain repeat Blood cultures today. -Continue IVF, vancomycin, Zosyn. Recheck lactic acid in am.   Acute kidney injury  -unknown baseline - improving since admission.   Right knee pain -Given the patient was septic, orthopedics was consulted and appreciated -Patient status post right knee aspiration which showed 66,000 WBC -No growth to date -Suspect this could be reactive inflammatory type of infusion -Will consult PT and OT.  ?Psoriasis  -Patient needs to follow up dermatology as an outpatient -patient has scaly skin, with multiple excoriations   Sinus tachycardia -Likely secondary to sepsis -TSH 2.273, magnesium 2  Transient hypotension -resolved with IVF -Likely secondary to sepsis.  Normocytic anemia  -Unknown baseline hemoglobin, currently 9.5 -Continue to monitor CBC -Anemia panel shows iron of 12, ferritin of 80 - stool for occult blood is negative.    Code Status: full code.  Family Communication: none at bedside Disposition Plan: pending PT eval .    Consultants:  Orthopedics.   Procedures:  RIGHT knee aspiration 11/22  Antibiotics:  Vancomycin 11/21  Zosyn 11/21  HPI/Subjective: He  wants to eat breakfast. He denies any new complaints.    Objective: Filed Vitals:   02/10/15 0807 02/10/15 0900  BP:  111/63  Pulse:    Temp: 97.1 F (36.2 C)   Resp:  30    Intake/Output Summary (Last 24 hours) at 02/10/15 1025 Last data filed at 02/10/15 0900  Gross per 24 hour  Intake 3391.33 ml  Output    351 ml  Net 3040.33 ml   Filed Weights   02/08/15 1619 02/08/15 2252  Weight: 70.308 kg (155 lb) 77.9 kg (171 lb 11.8 oz)    Exam:   General:  Alert sitting in the bed.   Cardiovascular: s1s2, tachycardic  Respiratory: clear on auscultation, no wheezing or rhonchi.   Abdomen: soft non tender non distended bowel sounds heard.   Musculoskeletal: right knee swollen, no pedal edema.   Data Reviewed: Basic Metabolic Panel:  Recent Labs Lab 02/08/15 1619 02/09/15 0200 02/10/15 0312  NA 137 141 143  K 4.6 4.3 4.2  CL 106 112* 115*  CO2 22 22 22   GLUCOSE 89 86 100*  BUN 22* 25* 30*  CREATININE 1.55* 1.75* 1.57*  CALCIUM 7.6* 7.4* 7.3*  MG  --  2.0  --   PHOS  --  3.9  --    Liver Function Tests:  Recent Labs Lab 02/08/15 1619 02/09/15 0200  AST 30 40  ALT 21 22  ALKPHOS 101 89  BILITOT 1.5* 1.2  PROT 6.2* 5.5*  ALBUMIN 2.2* 2.0*   No results for input(s): LIPASE, AMYLASE in the last 168 hours. No results for input(s): AMMONIA in the last 168 hours. CBC:  Recent Labs Lab 02/08/15 1619 02/09/15 0200 02/10/15 0312  WBC 21.5* 21.4* 22.1*  NEUTROABS 14.2* 12.4*  --  HGB 10.7* 9.5* 9.5*  HCT 34.3* 29.9* 29.4*  MCV 88.2 85.9 87.8  PLT 388 373 420*   Cardiac Enzymes: No results for input(s): CKTOTAL, CKMB, CKMBINDEX, TROPONINI in the last 168 hours. BNP (last 3 results) No results for input(s): BNP in the last 8760 hours.  ProBNP (last 3 results) No results for input(s): PROBNP in the last 8760 hours.  CBG: No results for input(s): GLUCAP in the last 168 hours.  Recent Results (from the past 240 hour(s))  Blood Culture (routine x  2)     Status: None (Preliminary result)   Collection Time: 02/08/15  2:23 PM  Result Value Ref Range Status   Specimen Description BLOOD RIGHT ARM  Final   Special Requests IN PEDIATRIC BOTTLE 3CC  Final   Culture  Setup Time   Final    GRAM POSITIVE RODS AEROBIC BOTTLE ONLY CRITICAL RESULT CALLED TO, READ BACK BY AND VERIFIED WITH: A [facility] RN 2046 02/09/15 A BROWNING    Culture   Final    NO GROWTH < 24 HOURS Performed at [facility]    Report Status PENDING  Incomplete  Blood Culture (routine x 2)     Status: None (Preliminary result)   Collection Time: 02/08/15  2:31 PM  Result Value Ref Range Status   Specimen Description RIGHT ANTECUBITAL  Final   Special Requests IN PEDIATRIC BOTTLE 3CC  Final   Culture  Setup Time   Final    GRAM POSITIVE COCCI IN CLUSTERS GRAM POSITIVE COCCI IN CHAINS AEROBIC BOTTLE ONLY CRITICAL RESULT CALLED TO, READ BACK BY AND VERIFIED WITH: SCOTT @0715  02/09/15 MKELLY     Culture   Final    NO GROWTH < 24 HOURS Performed at [facility]    Report Status PENDING  Incomplete  Urine culture     Status: None   Collection Time: 02/08/15  5:52 PM  Result Value Ref Range Status   Specimen Description URINE, CLEAN CATCH  Final   Special Requests NONE  Final   Culture   Final    >=100,000 COLONIES/mL METHICILLIN RESISTANT STAPHYLOCOCCUS AUREUS Performed at [facility]    Report Status 02/10/2015 FINAL  Final   Organism ID, Bacteria METHICILLIN RESISTANT STAPHYLOCOCCUS AUREUS  Final      Susceptibility   Methicillin resistant staphylococcus aureus - MIC*    CIPROFLOXACIN >=8 RESISTANT Resistant     GENTAMICIN <=0.5 SENSITIVE Sensitive     NITROFURANTOIN <=16 SENSITIVE Sensitive     OXACILLIN >=4 RESISTANT Resistant     TETRACYCLINE <=1 SENSITIVE Sensitive     VANCOMYCIN 1 SENSITIVE Sensitive     TRIMETH/SULFA <=10 SENSITIVE Sensitive     CLINDAMYCIN <=0.25 SENSITIVE Sensitive     RIFAMPIN <=0.5 SENSITIVE Sensitive      Inducible Clindamycin NEGATIVE Sensitive     * >=100,000 COLONIES/mL METHICILLIN RESISTANT STAPHYLOCOCCUS AUREUS  Body fluid culture     Status: None (Preliminary result)   Collection Time: 02/08/15  7:42 PM  Result Value Ref Range Status   Specimen Description SYNOVIAL R KNEE  Final   Special Requests Normal  Final   Gram Stain   Final    ABUNDANT WBC PRESENT, PREDOMINANTLY PMN NO ORGANISMS SEEN Gram Stain Report Called to,Read Back By and Verified With: Ulyses Amor [id number] @ 2126 BY J SCOTTON    Culture PENDING  Incomplete   Report Status PENDING  Incomplete  MRSA PCR Screening     Status: Abnormal   Collection Time:  02/08/15 10:41 PM  Result Value Ref Range Status   MRSA by PCR POSITIVE (A) NEGATIVE Final    Comment:        The GeneXpert MRSA Assay (FDA approved for NASAL specimens only), is one component of a comprehensive MRSA colonization surveillance program. It is not intended to diagnose MRSA infection nor to guide or monitor treatment for MRSA infections. RESULT CALLED TO, READ BACK BY AND VERIFIED WITH: CROFTS,S RN [phone] [place],N      Studies: Dg Chest Port 1 View  02/08/2015  CLINICAL DATA:  64 year old male with fever EXAM: PORTABLE CHEST 1 VIEW COMPARISON:  Right knee radiographs obtained concurrently FINDINGS: Heart is upper limits of normal for size. Mediastinal contours are within normal limits. Atherosclerotic calcifications are present in the transverse aorta. The lungs are clear. No pleural effusion or pneumothorax. No acute osseous abnormality. IMPRESSION: 1. Borderline cardiomegaly. 2. Aortic atherosclerosis. 3. No acute cardiopulmonary process. Electronically Signed   By: Malachy Moan M.D.   On: 02/08/2015 17:50   Dg Knee Right Port  02/08/2015  CLINICAL DATA:  64 year old male with 1 day history of right knee pain and low-grade fever EXAM: PORTABLE RIGHT KNEE - 1-2 VIEW COMPARISON:  Prior radiographs of the lower leg including the ankle  09/27/2010 FINDINGS: Positive for small ankle joint effusion. No evidence of acute fracture or malalignment. Normal bony mineralization. No lytic or blastic osseous lesion. No periosteal reaction. Mild tricompartmental degenerative change. IMPRESSION: 1. Positive for small suprapatellar knee joint effusion. Septic arthritis should be considered within the differential given the clinical history of fever. Additionally, degenerative and inflammatory effusions are considerations. 2. Mild tricompartmental osteoarthritis. Electronically Signed   By: Malachy Moan M.D.   On: 02/08/2015 17:52    Scheduled Meds: . Chlorhexidine Gluconate Cloth  6 each Topical Q0600  . feeding supplement (ENSURE ENLIVE)  237 mL Oral BID BM  . hydrocerin   Topical BID  . mupirocin ointment  1 application Nasal BID  . piperacillin-tazobactam (ZOSYN)  IV  3.375 g Intravenous Q8H  . sodium chloride  1,000 mL Intravenous Once  . sodium chloride  3 mL Intravenous Q12H  . vancomycin  750 mg Intravenous Q12H   Continuous Infusions: . sodium chloride      Active Problems:   Acute renal failure (ARF) (HCC)   Anemia   Cellulitis   Sepsis (HCC)   UTI (lower urinary tract infection)   Severe sepsis (HCC)    Time spent: 25 minutes    Aliviah Spain  [facility] Pager [phone]. If 7PM-7AM, please contact night-coverage at [email or web address], password [email or web address] 02/10/2015, 10:25 AM  LOS: 2 days

## 2015-02-10 NOTE — Progress Notes (Signed)
     Subjective: RIGHT knee pain in the presents of sepsis secondary to urinary tract infection  Patient reports pain as moderate, pain does states that the knee pain has increased some today.  No events otherwise throughout the night.  Objective:   VITALS:    02/10/15  BP: 111/63  Pulse: 138  Temp: 97.1 F (36.2 C)  Resp: 30    Dorsiflexion/Plantar flexion intact No cellulitis present  LABS  Recent Labs  02/08/15 1619 02/09/15 0200 02/10/15 0312  HGB 10.7* 9.5* 9.5*  HCT 34.3* 29.9* 29.4*  WBC 21.5* 21.4* 22.1*  PLT 388 373 420*     Recent Labs  02/08/15 1619 02/09/15 0200 02/10/15 0312  NA 137 141 143  K 4.6 4.3 4.2  BUN 22* 25* 30*  CREATININE 1.55* 1.75* 1.57*  GLUCOSE 89 86 100*     Assessment/Plan: RIGHT knee pain in the presents of sepsis secondary to urinary tract infection    S/P aspiration of right knee effusion. No bacteria seen on gram stain. 66K WBC mostly polys in the fluid. ? Reactive inflammatory effusion. Continued treatment with IV antibiotics at this time. Orthopaedics will continue to follow the patient and any progress of pain in the right knee. Will follow cultures and clinical status. Bed rest with bathroom privileges.    Anastasio AuerbachMatthew S. Bethenny Losee   PAC  02/10/2015, 10:05 AM

## 2015-02-10 NOTE — Progress Notes (Signed)
PT Cancellation Note  Patient Details Name: Derryl Harborugene Hage MRN: [id number] DOB: January 02, 1951   Cancelled Treatment:    Reason Eval/Treat Not Completed: Medical issues which prohibited therapy ([facility] will check back as schedule allows. )   Rada HayHill, Ivianna Notch Elizabeth 02/10/2015, 5:52 PM

## 2015-02-10 NOTE — Progress Notes (Signed)
Pharmacy Antibiotic Time-Out Note  Charles [name] is a 64 y.o. year-old male admitted on 02/08/2015.  The patient is currently on Vancomycin and Zosyn for sepsis, UTI, possible bacteremia.  Assessment/Plan: The dose of vancomycin will be adjusted to 750mg  IV q12h based on renal function.  Check trough at steady state (goal = 15-20 mcg/ml) Continue Zosyn 3.375gm IV q8h (4hr extended infusions) Follow up renal function & cultures, clinical course   Temp (24hrs), Avg:97.7 F (36.5 C), Min:97.1 F (36.2 C), Max:98 F (36.7 C)   Recent Labs Lab 02/08/15 1619 02/09/15 0200 02/10/15 0312  WBC 21.5* 21.4* 22.1*    Recent Labs Lab 02/08/15 1619 02/09/15 0200 02/10/15 0312  CREATININE 1.55* 1.75* 1.57*   Estimated Creatinine Clearance: 43.3 mL/min (by C-G formula based on Cr of 1.57).    Antimicrobial allergies: none  Antimicrobials this admission: 11/21 >> vancomycin >> 11/21 >> zosyn >>   11/21 blood: 1 of 2 gram positive rods 11/21 blood: 1 of 2 GPC in clusters 11/21 urine: >100k MRSA 11/21 MRSA PCR: positive 11/21 R knee: abundant WBC, no organisms seen 11/22 blood x 2: ngtd 11/22 stool: sent  Levels/dose changes this admission:  Thank you for allowing pharmacy to be a part of this patient's care.  Loralee PacasErin Saif Peter, PharmD, BCPS Pager: [phone]  02/10/2015 9:56 AM

## 2015-02-10 NOTE — Consult Note (Addendum)
Date of Admission:  02/08/2015  Date of Consult:  02/10/2015  Reason for Consult: staphylococcus aureus and group C streptococcus Referring Physician: Dr. Karleen Hampshire   HPI: Charles [name] is an 64 y.o. male with history of eczema who has been suffering from full body rash that is intensely pruritic and which he has been scratching with recent admission to [facility]. He presented to [facility] with severe right knee pain, worsening of diffuse rash. In ED he showed evidence of severe sepsis.  He had blood cultures drawn and vancomycin and zosyn were started. His knee was aspirated by Dr. Veverly Fells in ED [place] [address] with 68% PMNs, no crystals seen, no organisms seen and NG x 1 days. Blood cultures are growing Staphylococcus aureus and Group C streptococcus in 1/2 blood cultures, the other with diptheroids, urine cx with MRSA. He has been on vancomycin and zosyn and has improvement in hemodynamics. His knee is still exquisitely tender and even moving his ankle elicits severe pain in left knee where he has effusion. He has diffuse rash with mx areas of excoriation.  Past Medical History  Diagnosis Date  . Eczema     Past Surgical History  Procedure Laterality Date  . No past surgeries      Social History:  reports that he has been smoking Cigarettes.  He has been smoking about 1.00 pack per day. He does not have any smokeless tobacco history on file. He reports that he drinks alcohol. He reports that he does not use illicit drugs.   Family History  Problem Relation Age of Onset  . Hypertension Other     No Known Allergies   Medications: I have reviewed patients current medications as documented in Epic Anti-infectives    Start     Dose/Rate Route Frequency Ordered Stop   02/10/15 1230  cefTRIAXone (ROCEPHIN) 2 g in dextrose 5 % 50 mL IVPB     2 g 100 mL/hr over 30 Minutes Intravenous Every 24 hours 02/10/15 1158     02/10/15 1200  vancomycin (VANCOCIN) IVPB 750 mg/150 ml  premix     750 mg 150 mL/hr over 60 Minutes Intravenous Every 12 hours 02/10/15 1000     02/09/15 1600  vancomycin (VANCOCIN) 1,250 mg in sodium chloride 0.9 % 250 mL IVPB  Status:  Discontinued     1,250 mg 166.7 mL/hr over 90 Minutes Intravenous Every 24 hours 02/08/15 1725 02/09/15 0743   02/09/15 1600  vancomycin (VANCOCIN) IVPB 1000 mg/200 mL premix  Status:  Discontinued     1,000 mg 200 mL/hr over 60 Minutes Intravenous Every 24 hours 02/09/15 0743 02/10/15 1000   02/09/15 0000  piperacillin-tazobactam (ZOSYN) IVPB 3.375 g  Status:  Discontinued     3.375 g 12.5 mL/hr over 240 Minutes Intravenous Every 8 hours 02/08/15 1725 02/10/15 1157   02/08/15 1600  vancomycin (VANCOCIN) IVPB 1000 mg/200 mL premix     1,000 mg 200 mL/hr over 60 Minutes Intravenous  Once 02/08/15 1548 02/08/15 1802   02/08/15 1600  piperacillin-tazobactam (ZOSYN) IVPB 3.375 g     3.375 g 100 mL/hr over 30 Minutes Intravenous  Once 02/08/15 1548 02/08/15 1701         ROS: as per HPI + rash, pruritis, dry skin, light headedness, weakness, knee pain malaise, otherwise 12 point ROS is negative.    Blood pressure 113/64, pulse 124, temperature 98.1 F (36.7 C), temperature source Oral, resp. rate 21,  height _0  (1.702 m), weight 171 lb 11.8 oz (77.9 kg), SpO2 100 %. General: Alert and awake, oriented x3, uncomfortable appearing HEENT: anicteric sclera,  EOMI, oropharynx clear and without exudate Cardiovascular: tachy rate normal r,  no murmur rubs or gallops Pulmonary: clear to auscultation bilaterally, no wheezing, rales or rhonchi Gastrointestinal: soft nontender, nondistended, normal bowel sounds, Musculoskeletal: no  clubbing or edema noted bilaterally Skin, exfoliative rash on face, several areas of denudation from excoriation see pictures  02/10/15:                    Right knee with effusion, exquisitely tender and pain elicited with minimum of movement of the joint  Neuro:  nonfocal, strength and sensation intact   Results for orders placed or performed during the hospital encounter of 02/08/15 (from the past 48 hour(s))  Urinalysis, Routine w reflex microscopic (not at [facility])     Status: Abnormal   Collection Time: 02/08/15  5:51 PM  Result Value Ref Range   Color, Urine AMBER (A) YELLOW    Comment: BIOCHEMICALS MAY BE AFFECTED BY COLOR   APPearance CLOUDY (A) CLEAR   Specific Gravity, Urine 1.019 1.005 - 1.030   pH 6.0 5.0 - 8.0   Glucose, UA NEGATIVE NEGATIVE mg/dL   Hgb urine dipstick MODERATE (A) NEGATIVE   Bilirubin Urine NEGATIVE NEGATIVE   Ketones, ur NEGATIVE NEGATIVE mg/dL   Protein, ur 30 (A) NEGATIVE mg/dL   Nitrite POSITIVE (A) NEGATIVE   Leukocytes, UA LARGE (A) NEGATIVE  Urine microscopic-add on     Status: Abnormal   Collection Time: 02/08/15  5:51 PM  Result Value Ref Range   Squamous Epithelial / LPF NONE SEEN NONE SEEN    Comment: Please note change in reference range.   WBC, UA TOO NUMEROUS TO COUNT 0 - 5 WBC/hpf    Comment: Please note change in reference range.   RBC / HPF 6-30 0 - 5 RBC/hpf    Comment: Please note change in reference range.   Bacteria, UA MANY (A) NONE SEEN    Comment: Please note change in reference range.  Urine culture     Status: None   Collection Time: 02/08/15  5:52 PM  Result Value Ref Range   Specimen Description URINE, CLEAN CATCH    Special Requests NONE    Culture      >=100,000 COLONIES/mL METHICILLIN RESISTANT STAPHYLOCOCCUS AUREUS Performed at [facility]    Report Status 02/10/2015 FINAL    Organism ID, Bacteria METHICILLIN RESISTANT STAPHYLOCOCCUS AUREUS       Susceptibility   Methicillin resistant staphylococcus aureus - MIC*    CIPROFLOXACIN >=8 RESISTANT Resistant     GENTAMICIN <=0.5 SENSITIVE Sensitive     NITROFURANTOIN <=16 SENSITIVE Sensitive     OXACILLIN >=4 RESISTANT Resistant     TETRACYCLINE <=1 SENSITIVE Sensitive     VANCOMYCIN 1 SENSITIVE Sensitive      TRIMETH/SULFA <=10 SENSITIVE Sensitive     CLINDAMYCIN <=0.25 SENSITIVE Sensitive     RIFAMPIN <=0.5 SENSITIVE Sensitive     Inducible Clindamycin NEGATIVE Sensitive     * >=100,000 COLONIES/mL METHICILLIN RESISTANT STAPHYLOCOCCUS AUREUS  I-Stat CG4 Lactic Acid, ED  (not at  [facility])     Status: Abnormal   Collection Time: 02/08/15  6:33 PM  Result Value Ref Range   Lactic Acid, Venous 2.81 (HH) 0.5 - 2.0 mmol/L   Comment NOTIFIED PHYSICIAN   Body fluid culture     Status: None (  Preliminary result)   Collection Time: 02/08/15  7:42 PM  Result Value Ref Range   Specimen Description SYNOVIAL R KNEE    Special Requests Normal    Gram Stain      ABUNDANT WBC PRESENT, PREDOMINANTLY PMN NO ORGANISMS SEEN Gram Stain Report Called to,Read Back By and Verified With: Mort Sawyers [id number] @ 2126 BY J SCOTTON    Culture      NO GROWTH 1 DAY Performed at [facility]    Report Status PENDING   Synovial cell count + diff, w/ crystals     Status: Abnormal   Collection Time: 02/08/15  7:42 PM  Result Value Ref Range   Color, Synovial YELLOW YELLOW   Appearance-Synovial TURBID (A) CLEAR   Crystals, Fluid NO CRYSTALS SEEN    WBC, Synovial 61207 (H) 0 - 200 /cu mm   Neutrophil, Synovial 88 (H) 0 - 25 %   Lymphocytes-Synovial Fld 1 0 - 20 %   Monocyte-Macrophage-Synovial Fluid 10 (L) 50 - 90 %   Eosinophils-Synovial 1 0 - 1 %  MRSA PCR Screening     Status: Abnormal   Collection Time: 02/08/15 10:41 PM  Result Value Ref Range   MRSA by PCR POSITIVE (A) NEGATIVE    Comment:        The GeneXpert MRSA Assay (FDA approved for NASAL specimens only), is one component of a comprehensive MRSA colonization surveillance program. It is not intended to diagnose MRSA infection nor to guide or monitor treatment for MRSA infections. RESULT CALLED TO, READ BACK BY AND VERIFIED WITH: CROFTS,S RN [phone] COVINGTON,N   Lactic acid, plasma     Status: Abnormal   Collection Time: 02/08/15  11:40 PM  Result Value Ref Range   Lactic Acid, Venous 2.5 (HH) 0.5 - 2.0 mmol/L    Comment: CRITICAL RESULT CALLED TO, READ BACK BY AND VERIFIED WITH: CROSTS,S RN [phone] COVINGTON,N   Procalcitonin     Status: None   Collection Time: 02/08/15 11:40 PM  Result Value Ref Range   Procalcitonin 2.65 ng/mL    Comment:        Interpretation: PCT > 2 ng/mL: Systemic infection (sepsis) is likely, unless other causes are known. (NOTE)         ICU PCT Algorithm               Non ICU PCT Algorithm    ----------------------------     ------------------------------         PCT < 0.25 ng/mL                 PCT < 0.1 ng/mL     Stopping of antibiotics            Stopping of antibiotics       strongly encouraged.               strongly encouraged.    ----------------------------     ------------------------------       PCT level decrease by               PCT < 0.25 ng/mL       >= 80% from peak PCT       OR PCT 0.25 - 0.5 ng/mL          Stopping of antibiotics  encouraged.     Stopping of antibiotics           encouraged.    ----------------------------     ------------------------------       PCT level decrease by              PCT >= 0.25 ng/mL       < 80% from peak PCT        AND PCT >= 0.5 ng/mL            Continuing antibiotics                                               encouraged.       Continuing antibiotics            encouraged.    ----------------------------     ------------------------------     PCT level increase compared          PCT > 0.5 ng/mL         with peak PCT AND          PCT >= 0.5 ng/mL             Escalation of antibiotics                                          strongly encouraged.      Escalation of antibiotics        strongly encouraged.   Type and screen [place]     Status: None   Collection Time: 02/09/15 12:02 AM  Result Value Ref Range   ABO/RH(D) B POS    Antibody Screen NEG     Sample Expiration 02/12/2015   Protime-INR     Status: Abnormal   Collection Time: 02/09/15 12:02 AM  Result Value Ref Range   Prothrombin Time 19.3 (H) 11.6 - 15.2 seconds   INR 1.63 (H) 0.00 - 1.49  APTT     Status: None   Collection Time: 02/09/15 12:02 AM  Result Value Ref Range   aPTT 30 24 - 37 seconds  ABO/Rh     Status: None   Collection Time: 02/09/15 12:02 AM  Result Value Ref Range   ABO/RH(D) B POS   Creatinine, urine, random     Status: None   Collection Time: 02/09/15  1:03 AM  Result Value Ref Range   Creatinine, Urine 78.18 mg/dL    Comment: Performed at [facility]  Sodium, urine, random     Status: None   Collection Time: 02/09/15  1:03 AM  Result Value Ref Range   Sodium, Ur 31 mmol/L    Comment: Performed at [facility]  Folate     Status: None   Collection Time: 02/09/15  2:00 AM  Result Value Ref Range   Folate 10.2 >5.9 ng/mL    Comment: Performed at [facility]  Reticulocytes     Status: Abnormal   Collection Time: 02/09/15  2:00 AM  Result Value Ref Range   Retic Ct Pct 3.9 (H) 0.4 - 3.1 %   RBC. 3.48 (L) 4.22 - 5.81 MIL/uL   Retic Count, Manual 135.7 19.0 - 186.0 K/uL  CBC with Differential     Status: Abnormal  Collection Time: 02/09/15  2:00 AM  Result Value Ref Range   WBC 21.4 (H) 4.0 - 10.5 K/uL   RBC 3.48 (L) 4.22 - 5.81 MIL/uL   Hemoglobin 9.5 (L) 13.0 - 17.0 g/dL   HCT 29.9 (L) 39.0 - 52.0 %   MCV 85.9 78.0 - 100.0 fL   MCH 27.3 26.0 - 34.0 pg   MCHC 31.8 30.0 - 36.0 g/dL   RDW 18.0 (H) 11.5 - 15.5 %   Platelets 373 150 - 400 K/uL   Neutrophils Relative % 58 %   Lymphocytes Relative 19 %   Monocytes Relative 7 %   Eosinophils Relative 15 %   Basophils Relative 1 %   Neutro Abs 12.4 (H) 1.7 - 7.7 K/uL   Lymphs Abs 4.1 (H) 0.7 - 4.0 K/uL   Monocytes Absolute 1.5 (H) 0.1 - 1.0 K/uL   Eosinophils Absolute 3.2 (H) 0.0 - 0.7 K/uL   Basophils Absolute 0.2 (H) 0.0 - 0.1 K/uL   RBC Morphology TARGET CELLS      Comment: POLYCHROMASIA PRESENT  Comprehensive metabolic panel     Status: Abnormal   Collection Time: 02/09/15  2:00 AM  Result Value Ref Range   Sodium 141 135 - 145 mmol/L   Potassium 4.3 3.5 - 5.1 mmol/L   Chloride 112 (H) 101 - 111 mmol/L   CO2 22 22 - 32 mmol/L   Glucose, Bld 86 65 - 99 mg/dL   BUN 25 (H) 6 - 20 mg/dL   Creatinine, Ser 1.75 (H) 0.61 - 1.24 mg/dL   Calcium 7.4 (L) 8.9 - 10.3 mg/dL   Total Protein 5.5 (L) 6.5 - 8.1 g/dL   Albumin 2.0 (L) 3.5 - 5.0 g/dL   AST 40 15 - 41 U/L   ALT 22 17 - 63 U/L   Alkaline Phosphatase 89 38 - 126 U/L   Total Bilirubin 1.2 0.3 - 1.2 mg/dL   GFR calc non Af Amer 39 (L) >60 mL/min   GFR calc Af Amer 45 (L) >60 mL/min    Comment: (NOTE) The eGFR has been calculated using the CKD EPI equation. This calculation has not been validated in all clinical situations. eGFR's persistently <60 mL/min signify possible Chronic Kidney Disease.    Anion gap 7 5 - 15  Lactic acid, plasma     Status: Abnormal   Collection Time: 02/09/15  2:00 AM  Result Value Ref Range   Lactic Acid, Venous 2.6 (HH) 0.5 - 2.0 mmol/L    Comment: CRITICAL RESULT CALLED TO, READ BACK BY AND VERIFIED WITH: [facility] RN [phone] COVINGTON,N   Magnesium     Status: None   Collection Time: 02/09/15  2:00 AM  Result Value Ref Range   Magnesium 2.0 1.7 - 2.4 mg/dL  Phosphorus     Status: None   Collection Time: 02/09/15  2:00 AM  Result Value Ref Range   Phosphorus 3.9 2.5 - 4.6 mg/dL  TSH     Status: None   Collection Time: 02/09/15  2:00 AM  Result Value Ref Range   TSH 2.273 0.350 - 4.500 uIU/mL  Vitamin B12     Status: None   Collection Time: 02/09/15  2:05 AM  Result Value Ref Range   Vitamin B-12 374 180 - 914 pg/mL    Comment: (NOTE) This assay is not validated for testing neonatal or myeloproliferative syndrome specimens for Vitamin B12 levels. Performed at [facility]   Iron and TIBC  Status: Abnormal   Collection Time: 02/09/15  2:05  AM  Result Value Ref Range   Iron 12 (L) 45 - 182 ug/dL   TIBC 158 (L) 250 - 450 ug/dL   Saturation Ratios 8 (L) 17.9 - 39.5 %   UIBC 146 ug/dL    Comment: Performed at [facility]  Ferritin     Status: None   Collection Time: 02/09/15  2:05 AM  Result Value Ref Range   Ferritin 80 24 - 336 ng/mL    Comment: Performed at [place]     Status: Abnormal   Collection Time: 02/09/15  2:30 PM  Result Value Ref Range   Prothrombin Time 18.6 (H) 11.6 - 15.2 seconds   INR 1.55 (H) 0.00 - 1.49  Culture, blood (routine x 2)     Status: None (Preliminary result)   Collection Time: 02/09/15  2:30 PM  Result Value Ref Range   Specimen Description BLOOD RIGHT ANTECUBITAL    Special Requests BOTTLES DRAWN AEROBIC AND ANAEROBIC 10CC    Culture      NO GROWTH < 24 HOURS Performed at [facility]    Report Status PENDING   Occult blood card to lab, stool RN will collect     Status: None   Collection Time: 02/09/15  5:30 PM  Result Value Ref Range   Fecal Occult Bld NEGATIVE NEGATIVE  Fecal lactoferrin     Status: None   Collection Time: 02/09/15  5:36 PM  Result Value Ref Range   Specimen Description STOOL    Special Requests NONE    Fecal Lactoferrin POSITIVE Performed at [facility]     Report Status 02/10/2015 FINAL   CBC     Status: Abnormal   Collection Time: 02/10/15  3:12 AM  Result Value Ref Range   WBC 22.1 (H) 4.0 - 10.5 K/uL   RBC 3.35 (L) 4.22 - 5.81 MIL/uL   Hemoglobin 9.5 (L) 13.0 - 17.0 g/dL   HCT 29.4 (L) 39.0 - 52.0 %   MCV 87.8 78.0 - 100.0 fL   MCH 28.4 26.0 - 34.0 pg   MCHC 32.3 30.0 - 36.0 g/dL   RDW 18.5 (H) 11.5 - 15.5 %   Platelets 420 (H) 150 - 400 K/uL  Basic metabolic panel     Status: Abnormal   Collection Time: 02/10/15  3:12 AM  Result Value Ref Range   Sodium 143 135 - 145 mmol/L   Potassium 4.2 3.5 - 5.1 mmol/L   Chloride 115 (H) 101 - 111 mmol/L   CO2 22 22 - 32 mmol/L   Glucose, Bld 100 (H) 65 -  99 mg/dL   BUN 30 (H) 6 - 20 mg/dL   Creatinine, Ser 1.57 (H) 0.61 - 1.24 mg/dL   Calcium 7.3 (L) 8.9 - 10.3 mg/dL   GFR calc non Af Amer 44 (L) >60 mL/min   GFR calc Af Amer 51 (L) >60 mL/min    Comment: (NOTE) The eGFR has been calculated using the CKD EPI equation. This calculation has not been validated in all clinical situations. eGFR's persistently <60 mL/min signify possible Chronic Kidney Disease.    Anion gap 6 5 - 15  Lactic acid, plasma     Status: Abnormal   Collection Time: 02/10/15  3:12 AM  Result Value Ref Range   Lactic Acid, Venous 2.5 (HH) 0.5 - 2.0 mmol/L    Comment: CRITICAL RESULT CALLED TO, READ BACK BY AND VERIFIED  WITH: A.WELCH,RN AT 0434 ON 02/10/15 BY W.SHEA    _0 (sdes,specrequest,cult,reptstatus)   ) Recent Results (from the past 720 hour(s))  Blood Culture (routine x 2)     Status: None   Collection Time: 02/08/15  2:23 PM  Result Value Ref Range Status   Specimen Description BLOOD RIGHT ARM  Final   Special Requests IN PEDIATRIC BOTTLE 3CC  Final   Culture  Setup Time   Final    GRAM POSITIVE RODS AEROBIC BOTTLE ONLY CRITICAL RESULT CALLED TO, READ BACK BY AND VERIFIED WITH: A [facility] RN [id number] 02/09/15 A BROWNING    Culture   Final    DIPHTHEROIDS(CORYNEBACTERIUM SPECIES) Standardized susceptibility testing for this organism is not available. Performed at [facility]    Report Status 02/10/2015 FINAL  Final  Blood Culture (routine x 2)     Status: None (Preliminary result)   Collection Time: 02/08/15  2:31 PM  Result Value Ref Range Status   Specimen Description RIGHT ANTECUBITAL  Final   Special Requests IN PEDIATRIC BOTTLE 3CC  Final   Culture  Setup Time   Final    GRAM POSITIVE COCCI IN CLUSTERS GRAM POSITIVE COCCI IN CHAINS AEROBIC BOTTLE ONLY CRITICAL RESULT CALLED TO, READ BACK BY AND VERIFIED WITH: SCOTT _1  02/09/15 MKELLY     Culture   Final    STREPTOCOCCUS GROUP C STAPHYLOCOCCUS AUREUS CULTURE  REINCUBATED FOR BETTER GROWTH Performed at [facility]    Report Status PENDING  Incomplete  Urine culture     Status: None   Collection Time: 02/08/15  5:52 PM  Result Value Ref Range Status   Specimen Description URINE, CLEAN CATCH  Final   Special Requests NONE  Final   Culture   Final    >=100,000 COLONIES/mL METHICILLIN RESISTANT STAPHYLOCOCCUS AUREUS Performed at [facility]    Report Status 02/10/2015 FINAL  Final   Organism ID, Bacteria METHICILLIN RESISTANT STAPHYLOCOCCUS AUREUS  Final      Susceptibility   Methicillin resistant staphylococcus aureus - MIC*    CIPROFLOXACIN >=8 RESISTANT Resistant     GENTAMICIN <=0.5 SENSITIVE Sensitive     NITROFURANTOIN <=16 SENSITIVE Sensitive     OXACILLIN >=4 RESISTANT Resistant     TETRACYCLINE <=1 SENSITIVE Sensitive     VANCOMYCIN 1 SENSITIVE Sensitive     TRIMETH/SULFA <=10 SENSITIVE Sensitive     CLINDAMYCIN <=0.25 SENSITIVE Sensitive     RIFAMPIN <=0.5 SENSITIVE Sensitive     Inducible Clindamycin NEGATIVE Sensitive     * >=100,000 COLONIES/mL METHICILLIN RESISTANT STAPHYLOCOCCUS AUREUS  Body fluid culture     Status: None (Preliminary result)   Collection Time: 02/08/15  7:42 PM  Result Value Ref Range Status   Specimen Description SYNOVIAL R KNEE  Final   Special Requests Normal  Final   Gram Stain   Final    ABUNDANT WBC PRESENT, PREDOMINANTLY PMN NO ORGANISMS SEEN Gram Stain Report Called to,Read Back By and Verified With: Mort Sawyers [id number] @ 2126 BY J SCOTTON    Culture   Final    NO GROWTH 1 DAY Performed at [facility]    Report Status PENDING  Incomplete  MRSA PCR Screening     Status: Abnormal   Collection Time: 02/08/15 10:41 PM  Result Value Ref Range Status   MRSA by PCR POSITIVE (A) NEGATIVE Final    Comment:        The GeneXpert MRSA Assay (FDA approved for NASAL specimens only), is one  component of a comprehensive MRSA colonization surveillance program. It is  not intended to diagnose MRSA infection nor to guide or monitor treatment for MRSA infections. RESULT CALLED TO, READ BACK BY AND VERIFIED WITH: CROFTS,S RN [phone] COVINGTON,N   Culture, blood (routine x 2)     Status: None (Preliminary result)   Collection Time: 02/09/15  2:30 PM  Result Value Ref Range Status   Specimen Description BLOOD RIGHT ANTECUBITAL  Final   Special Requests BOTTLES DRAWN AEROBIC AND ANAEROBIC 10CC  Final   Culture   Final    NO GROWTH < 24 HOURS Performed at [facility]    Report Status PENDING  Incomplete     Impression/Recommendation  Active Problems:   Acute renal failure (ARF) ([place])   Anemia   Cellulitis   Sepsis ([place])   UTI (lower urinary tract infection)   Severe sepsis (HCC)   Charles [name] is a 64 y.o. male with  Hx of severe eczema admitted with septic shock due to Staphylococcus aureus and Streptococcus with septic knee, urine seeded from blood  #1 SAB AND Steptococcal infection     Charles [name] Antimicrobial Management Team Staphylococcus aureus bacteremia   Staphylococcus aureus bacteremia (SAB) is associated with a high rate of complications and mortality.  Specific aspects of clinical management are critical to optimizing the outcome of patients with SAB.  Therefore, the [facility] Health Antimicrobial Management Team [facility]) has initiated an intervention aimed at improving the management of SAB at [facility].  To do so, Infectious Diseases physicians are providing an evidence-based consult for the management of all patients with SAB.     Yes No Comments  Perform follow-up blood cultures (even if the patient is afebrile) to ensure clearance of bacteremia _0  _1  BLOOD CULTURES TAKEN TODAY  Remove vascular catheter and obtain follow-up blood cultures after the removal of the catheter _2  _3  DO NOT PLACE PICC X 72-96 HOURS OF REPEAT CX WITH NO GROWTH  Perform echocardiography to evaluate for endocarditis (transthoracic ECHO is  40-50% sensitive, TEE is > 90% sensitive) _4  _5  Please keep in mind, that neither test can definitively EXCLUDE endocarditis, and that should clinical suspicion remain high for endocarditis the patient should then still be treated with an "endocarditis" duration of therapy = 6 weeks TTE without vegetations, would get TEE when able  Consult electrophysiologist to evaluate implanted cardiac device (pacemaker, ICD) _6  _7  NA  Ensure source control _8  _9  Have all abscesses been drained effectively? Have deep seeded infections (septic joints or osteomyelitis) had appropriate surgical debridement?  The KNEE IS INFECTED AND NEEDS FORMAL SURGERY  Investigate for "metastatic" sites of infection _10  _11  Does the patient have ANY symptom or physical exam finding that would suggest a deeper infection (back or neck pain that may be suggestive of vertebral osteomyelitis or epidural abscess, muscle pain that could be a symptom of pyomyositis)?  Keep in mind that for deep seeded infections MRI imaging with contrast is preferred rather than other often insensitive tests such as plain x-rays, especially early in a patient's presentation.  KNEE WITH SIG EFFUSION IN SETTING OF BACTEREMIA AND WBC OF 60K + IS INFECTED, ONLY REASON NOT GROWING AN ORGANISM IS ANTECEDENT ABX PRIOR TO ASPIRATE  Change antibiotic therapy to VANCOMYCIN AND ROCEPHIN _12  _13  Beta-lactam antibiotics are preferred for MSSA due to higher cure rates.   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   02/10/2015, 5:39 PM   Thank you so much for this interesting consult  [place] for [place] [phone] (pager) [phone] (office) 02/10/2015, 5:39 PM  Rhina Br[place] 02/10/2015, 5:39 PM

## 2015-02-11 MED ORDER — HYDROCODONE-ACETAMINOPHEN 5-325 MG PO TABS
1.0000 | ORAL_TABLET | ORAL | Status: DC | PRN
  Administered 2015-02-11 – 2015-02-16 (×8): 1 via ORAL
  Filled 2015-02-11 (×8): qty 1

## 2015-02-11 MED ORDER — VITAMINS A & D EX OINT
TOPICAL_OINTMENT | CUTANEOUS | Status: AC
  Administered 2015-02-11
  Filled 2015-02-11: qty 5

## 2015-02-11 NOTE — Progress Notes (Signed)
Pt refused AM lab draws; would not allow lab to stick him.

## 2015-02-11 NOTE — Evaluation (Signed)
Physical Therapy Evaluation Patient Details Name: Charles [name] MRN: [id number] DOB: 04-27-1950 Today's Date: 02/11/2015   History of Present Illness  64 y/o male with PMH of severe eczyma was admitted with sepsis a few days ago, + for UTI. R knee effusion aspirated two days ago before abx started. Dr. Daiva Eves recommends open knee arthrotomy. ortho following as well;   Clinical Impression  Pt admitted with above diagnosis. Pt currently with functional limitations due to the deficits listed below (see PT Problem List).  Pt will benefit from skilled PT to increase their independence and safety with mobility to allow discharge to the venue listed below.    Pt with uncontrollable itching, he is scratching constantly during session; His UEs and LEs are edematous and weeping profusely, his bed was saturated in fluid; RN and NT made aware; Pt is quite cooperative with PT, recommending SNF at this time d/t pt level of pain and  inability to care for himself in current state; recommend OT CONSULT as well;     Follow Up Recommendations SNF    Equipment Recommendations  None recommended by PT    Recommendations for Other Services       Precautions / Restrictions        Mobility  Bed Mobility Overal bed mobility: Needs Assistance Bed Mobility: Supine to Sit     Supine to sit: Supervision;HOB elevated     General bed mobility comments: HOB at 30*, incr time, line management  Transfers Overall transfer level: Needs assistance   Transfers: Squat Pivot Transfers     Squat pivot transfers: Min guard     General transfer comment: pt able to complete partial stand on LLE to transfer with min/guard for balance and safety; RLE too painful to WB  Ambulation/Gait             General Gait Details: unable d/t pain  Stairs            Wheelchair Mobility    Modified Rankin (Stroke Patients Only)       Balance Overall balance assessment: Needs assistance   Sitting  balance-Leahy Scale: Fair       Standing balance-Leahy Scale: Poor Standing balance comment: standing limited by RLE pain                             Pertinent Vitals/Pain Pain Assessment: No/denies pain (diffuse itching)    Home Living Family/patient expects to be discharged to:: Private residence Living Arrangements: Other relatives (brother)   Type of Home: House         Home Equipment: Environmental consultant - 2 wheels      Prior Function Level of Independence: Independent         Comments: pt reports he has to do for himself since his brother(s) work     Higher education careers adviser        Extremity/Trunk Assessment   Upper Extremity Assessment: Defer to OT evaluation           Lower Extremity Assessment: RLE deficits/detail RLE Deficits / Details: unable to touch d/t pain       Communication   Communication: No difficulties  Cognition Arousal/Alertness: Awake/alert Behavior During Therapy: WFL for tasks assessed/performed Overall Cognitive Status: Within Functional Limits for tasks assessed                      General Comments      Exercises  Assessment/Plan    PT Assessment Patient needs continued PT services  PT Diagnosis Acute pain;Difficulty walking   PT Problem List Decreased activity tolerance;Decreased balance;Decreased mobility;Pain  PT Treatment Interventions DME instruction;Gait training;Functional mobility training;Therapeutic activities;Therapeutic exercise;Patient/family education;Wheelchair mobility training   PT Goals (Current goals can be found in the Care Plan section) Acute Rehab PT Goals Patient Stated Goal: to be able to walk, less pain PT Goal Formulation: With patient Time For Goal Achievement: 02/25/15 Potential to Achieve Goals: Fair    Frequency Min 3X/week   Barriers to discharge        Co-evaluation               End of Session   Activity Tolerance: Patient limited by pain Patient left: in  chair;with call bell/phone within reach;with chair alarm set Nurse Communication: Mobility status         Time: 8295-6213 PT Time Calculation (min) (ACUTE ONLY): 32 min   Charges:   PT Evaluation $Initial PT Evaluation Tier I: 1 Procedure PT Treatments $Therapeutic Activity: 8-22 mins   PT G Codes:        [facility] February 25, 2015, 5:32 PM

## 2015-02-11 NOTE — Progress Notes (Signed)
Pt transferred from ICU to 1509. Pt AO x 4. Pt belongings are with pt at bedside. Plan of care was discussed with the pt. No questions or concerns from the pt at this time. Report called from FarsonRubie, Charity fundraiserN.  Rodgerick Gilliand W Tyeisha Dinan, RN

## 2015-02-11 NOTE — Progress Notes (Signed)
Subjective:  64 y/o male with PMH of severe eczyma was admitted with sepsis a few days ago.  He's on vanc / zosyn.  UA + for UTI.  R knee effusion aspirated two days ago before abx started.  Cell count was 66k.  cxs neg.  Dr. Daiva EvesVan Dam recommends open knee arthrotomy.  Pt says his knee is feeling some better since admission but not significantly better since yesterday.  Pt denies f/c/n/v.   Objective: Vital signs in last 24 hours: Temp:  [98.1 F (36.7 C)-98.8 F (37.1 C)] 98.1 F (36.7 C) (11/24 0454) Pulse Rate:  [43-129] 79 (11/24 0700) Resp:  [14-30] 14 (11/24 0700) BP: (105-158)/(50-84) 134/79 mmHg (11/24 0200) SpO2:  [91 %-100 %] 91 % (11/24 0700)  Intake/Output from previous day: 11/23 0701 - 11/24 0700 In: 3387 [P.O.:594; I.V.:2393; IV Piggyback:400] Out: 925 [Urine:925] Intake/Output this shift:     Recent Labs  02/08/15 1619 02/09/15 0200 02/10/15 0312  HGB 10.7* 9.5* 9.5*    Recent Labs  02/09/15 0200 02/10/15 0312  WBC 21.4* 22.1*  RBC 3.48*  3.48* 3.35*  HCT 29.9* 29.4*  PLT 373 420*    Recent Labs  02/09/15 0200 02/10/15 0312  NA 141 143  K 4.3 4.2  CL 112* 115*  CO2 22 22  BUN 25* 30*  CREATININE 1.75* 1.57*  GLUCOSE 86 100*  CALCIUM 7.4* 7.3*    Recent Labs  02/09/15 0002 02/09/15 1430  INR 1.63* 1.55*    PE:  elderly male in nad.  a and O x 4.  Mood and affect normal.  EOMI.  resp unlabored.  multiple superficial lesions on his skin over entire body.  R knee with moderate effusion.  No warmth or erythema.  TTP superomedially and superolaterally.  ROM 0-90 with painful ROM.  No lymphadenopathy.  Sens to LT intact.  5/5 strength in PF and DF of the ankle.  Brisk cap refill at the toes.  Assessment/Plan: R knee painful effusion in pt with h/o sepsis and  UTI - the lab values and cxs are more consistent with reactive effusion, but the differential includes pyarthrosis.  I explained the diagnosis to the patient in detail and recommended R knee  arthroscopy to irrigate and debride the joint.  I explained that leaving infection in the joint can lead to destruction of the joint and severe arthritis as well as risk of loss of limb or life.  The patient refuses surgery this morning.  He tells me that he wants to see how it does over the weekend before considering any surgery.  We'll continue to follow the patient.   Toni ArthursHEWITT, Aleisa Howk 02/11/2015, 8:09 AM

## 2015-02-11 NOTE — Progress Notes (Signed)
[facility] PROGRESS NOTE  Charles [name] ZOX:[id number] DOB: Dec 15, 1950 DOA: 02/08/2015 PCP: Charles [name],Charles [name] Interim summary: 64 y.o. male with h/o eczema presented with 24 hours of right knee pain. He was found to be in sepsis from MRSA UTI and right knee infection. He was started on vancomycin and zosyn. Orthopedics consulted.   Assessment/Plan: Sepsis secondary to urinary tract infection with hematuria/possible bacteremia -Upon admission, patient was febrile with tachycardia and tachypnea with leukocytosis -Lactic acid improving,. -UA: Positive nitrites, large leukocytes, TNTC WBC, many bacteria -Urine culture shows MRSA sensitive to vancomyin.  -Blood culture:  GPC and rods, identification and sensitivities pending. Repeat cultures ordered.  -Echocardiogram showed  Good LVEF , no vegetations seen.  -Continue IVF, vancomycin, Zosyn. Recheck lactic acid in am.   Acute kidney injury  -unknown baseline - improving since admission.   Right knee pain -Given the patient was septic, orthopedics was consulted and appreciated -Patient status post right knee aspiration which showed 66,000 WBC -No growth to date - patient will need knee washout, orthopedics consulted and but patient refused surgery.  -Will consult PT and OT.  ?Psoriasis  -Patient needs to follow up dermatology as an outpatient -patient has scaly skin, with multiple excoriations   Sinus tachycardia -Likely secondary to sepsis -TSH 2.273, magnesium 2  Transient hypotension -resolved with IVF -Likely secondary to sepsis.  Normocytic anemia  -Unknown baseline hemoglobin, currently 9.5 -Continue to monitor CBC -Anemia panel shows iron of 12, ferritin of 80 - stool for occult blood is negative.    Code Status: full code.  Family Communication: none at bedside Disposition Plan: pending PT eval .    Consultants:  Orthopedics.   Procedures:  RIGHT knee aspiration 11/22  Antibiotics:  Vancomycin  11/21  Zosyn 11/21  HPI/Subjective: He denies any new complaints. He does not want any kind of procedure these last three days.    Objective: Filed Vitals:   02/11/15 1201 02/11/15 1403  BP: 123/62 128/82  Pulse: 103 148  Temp: 97.3 F (36.3 C) 98.6 F (37 C)  Resp: 18 18    Intake/Output Summary (Last 24 hours) at 02/11/15 1649 Last data filed at 02/11/15 1255  Gross per 24 hour  Intake   3130 ml  Output    525 ml  Net   2605 ml   Filed Weights   02/08/15 1619 02/08/15 2252  Weight: 70.308 kg (155 lb) 77.9 kg (171 lb 11.8 oz)    Exam:   General:  Alert sitting in the bed.   Cardiovascular: s1s2, tachycardic  Respiratory: clear on auscultation, no wheezing or rhonchi.   Abdomen: soft non tender non distended bowel sounds heard.   Musculoskeletal: right knee swollen, no pedal edema.   Data Reviewed: Basic Metabolic Panel:  Recent Labs Lab 02/08/15 1619 02/09/15 0200 02/10/15 0312  NA 137 141 143  K 4.6 4.3 4.2  CL 106 112* 115*  CO2 22 22 22   GLUCOSE 89 86 100*  BUN 22* 25* 30*  CREATININE 1.55* 1.75* 1.57*  CALCIUM 7.6* 7.4* 7.3*  MG  --  2.0  --   PHOS  --  3.9  --    Liver Function Tests:  Recent Labs Lab 02/08/15 1619 02/09/15 0200  AST 30 40  ALT 21 22  ALKPHOS 101 89  BILITOT 1.5* 1.2  PROT 6.2* 5.5*  ALBUMIN 2.2* 2.0*   No results for input(s): LIPASE, AMYLASE in the last 168 hours. No results for input(s): AMMONIA in the last 168  hours. CBC:  Recent Labs Lab 02/08/15 1619 02/09/15 0200 02/10/15 0312  WBC 21.5* 21.4* 22.1*  NEUTROABS 14.2* 12.4*  --   HGB 10.7* 9.5* 9.5*  HCT 34.3* 29.9* 29.4*  MCV 88.2 85.9 87.8  PLT 388 373 420*   Cardiac Enzymes: No results for input(s): CKTOTAL, CKMB, CKMBINDEX, TROPONINI in the last 168 hours. BNP (last 3 results) No results for input(s): BNP in the last 8760 hours.  ProBNP (last 3 results) No results for input(s): PROBNP in the last 8760 hours.  CBG: No results for  input(s): GLUCAP in the last 168 hours.  Recent Results (from the past 240 hour(s))  Blood Culture (routine x 2)     Status: None   Collection Time: 02/08/15  2:23 PM  Result Value Ref Range Status   Specimen Description BLOOD RIGHT ARM  Final   Special Requests IN PEDIATRIC BOTTLE 3CC  Final   Culture  Setup Time   Final    GRAM POSITIVE RODS AEROBIC BOTTLE ONLY CRITICAL RESULT CALLED TO, READ BACK BY AND VERIFIED WITH: A [facility] RN 2046 02/09/15 A BROWNING    Culture   Final    DIPHTHEROIDS(CORYNEBACTERIUM SPECIES) Standardized susceptibility testing for this organism is not available. Performed at [facility]    Report Status 02/10/2015 FINAL  Final  Blood Culture (routine x 2)     Status: None (Preliminary result)   Collection Time: 02/08/15  2:31 PM  Result Value Ref Range Status   Specimen Description RIGHT ANTECUBITAL  Final   Special Requests IN PEDIATRIC BOTTLE 3CC  Final   Culture  Setup Time   Final    GRAM POSITIVE COCCI IN CLUSTERS GRAM POSITIVE COCCI IN CHAINS AEROBIC BOTTLE ONLY CRITICAL RESULT CALLED TO, READ BACK BY AND VERIFIED WITH: SCOTT @0715  02/09/15 MKELLY     Culture   Final    STREPTOCOCCUS GROUP C STAPHYLOCOCCUS AUREUS SUSCEPTIBILITIES TO FOLLOW Performed at [facility]    Report Status PENDING  Incomplete  Urine culture     Status: None   Collection Time: 02/08/15  5:52 PM  Result Value Ref Range Status   Specimen Description URINE, CLEAN CATCH  Final   Special Requests NONE  Final   Culture   Final    >=100,000 COLONIES/mL METHICILLIN RESISTANT STAPHYLOCOCCUS AUREUS Performed at [facility]    Report Status 02/10/2015 FINAL  Final   Organism ID, Bacteria METHICILLIN RESISTANT STAPHYLOCOCCUS AUREUS  Final      Susceptibility   Methicillin resistant staphylococcus aureus - MIC*    CIPROFLOXACIN >=8 RESISTANT Resistant     GENTAMICIN <=0.5 SENSITIVE Sensitive     NITROFURANTOIN <=16 SENSITIVE Sensitive     OXACILLIN  >=4 RESISTANT Resistant     TETRACYCLINE <=1 SENSITIVE Sensitive     VANCOMYCIN 1 SENSITIVE Sensitive     TRIMETH/SULFA <=10 SENSITIVE Sensitive     CLINDAMYCIN <=0.25 SENSITIVE Sensitive     RIFAMPIN <=0.5 SENSITIVE Sensitive     Inducible Clindamycin NEGATIVE Sensitive     * >=100,000 COLONIES/mL METHICILLIN RESISTANT STAPHYLOCOCCUS AUREUS  Body fluid culture     Status: None (Preliminary result)   Collection Time: 02/08/15  7:42 PM  Result Value Ref Range Status   Specimen Description SYNOVIAL R KNEE  Final   Special Requests Normal  Final   Gram Stain   Final    ABUNDANT WBC PRESENT, PREDOMINANTLY PMN NO ORGANISMS SEEN Gram Stain Report Called to,Read Back By and Verified With: Ulyses Amor [id number] @  2126 BY J SCOTTON    Culture   Final    NO GROWTH 2 DAYS Performed at [facility]    Report Status PENDING  Incomplete  MRSA PCR Screening     Status: Abnormal   Collection Time: 02/08/15 10:41 PM  Result Value Ref Range Status   MRSA by PCR POSITIVE (A) NEGATIVE Final    Comment:        The GeneXpert MRSA Assay (FDA approved for NASAL specimens only), is one component of a comprehensive MRSA colonization surveillance program. It is not intended to diagnose MRSA infection nor to guide or monitor treatment for MRSA infections. RESULT CALLED TO, READ BACK BY AND VERIFIED WITH: CROFTS,S RN [phone] COVINGTON,N   Culture, blood (routine x 2)     Status: None (Preliminary result)   Collection Time: 02/09/15  2:30 PM  Result Value Ref Range Status   Specimen Description BLOOD RIGHT ANTECUBITAL  Final   Special Requests BOTTLES DRAWN AEROBIC AND ANAEROBIC 10CC  Final   Culture   Final    NO GROWTH 2 DAYS Performed at [facility]    Report Status PENDING  Incomplete  Stool culture     Status: None (Preliminary result)   Collection Time: 02/09/15  5:36 PM  Result Value Ref Range Status   Specimen Description STOOL PERIRECTAL  Final   Special Requests  Immunocompromised  Final   Culture   Final    ABUNDANT STAPHYLOCOCCUS AUREUS Note: RIFAMPIN AND GENTAMICIN SHOULD NOT BE USED AS SINGLE DRUGS FOR TREATMENT OF STAPH INFECTIONS. Note: REDUCED NORMAL FLORA PRESENT Performed at [facility]    Report Status PENDING  Incomplete  Culture, blood (routine x 2)     Status: None (Preliminary result)   Collection Time: 02/10/15  3:45 PM  Result Value Ref Range Status   Specimen Description BLOOD RIGHT ARM  Final   Special Requests BOTTLES DRAWN AEROBIC AND ANAEROBIC 10 CC EA  Final   Culture   Final    NO GROWTH < 24 HOURS Performed at [facility]    Report Status PENDING  Incomplete  Culture, blood (routine x 2)     Status: None (Preliminary result)   Collection Time: 02/10/15  5:24 PM  Result Value Ref Range Status   Specimen Description BLOOD RIGHT ANTECUBITAL  Final   Special Requests BOTTLES DRAWN AEROBIC AND ANAEROBIC 10CC  Final   Culture   Final    NO GROWTH < 24 HOURS Performed at [facility]    Report Status PENDING  Incomplete     Studies: No results found.  Scheduled Meds: . cefTRIAXone (ROCEPHIN)  IV  2 g Intravenous Q24H  . Chlorhexidine Gluconate Cloth  6 each Topical Q0600  . feeding supplement (ENSURE ENLIVE)  237 mL Oral BID BM  . hydrocerin   Topical BID  . mupirocin ointment  1 application Nasal BID  . sodium chloride  3 mL Intravenous Q12H  . vancomycin  750 mg Intravenous Q12H   Continuous Infusions: . sodium chloride 100 mL/hr at 02/11/15 0603    Active Problems:   Acute renal failure (ARF) (HCC)   Anemia   Cellulitis   Sepsis (HCC)   UTI (lower urinary tract infection)   Severe sepsis (HCC)   Staphylococcus aureus bacteremia with sepsis (HCC)   Staphylococcal arthritis of right knee (HCC)   Erythroderma   Screen for STD (sexually transmitted disease)    Time spent: 25 minutes  [facility]  [facility] Pager [phone]. If 7PM-7AM, please contact  night-coverage at [email or web address], password [facility] 02/11/2015, 4:49 PM  LOS: 3 days

## 2015-02-12 LAB — CULTURE, BLOOD (ROUTINE X 2)

## 2015-02-12 LAB — CBC
HEMATOCRIT: 32 % — AB (ref 39.0–52.0)
HEMOGLOBIN: 10.1 g/dL — AB (ref 13.0–17.0)
MCH: 27.7 pg (ref 26.0–34.0)
MCHC: 31.6 g/dL (ref 30.0–36.0)
MCV: 87.7 fL (ref 78.0–100.0)
Platelets: 443 10*3/uL — ABNORMAL HIGH (ref 150–400)
RBC: 3.65 MIL/uL — AB (ref 4.22–5.81)
RDW: 18.6 % — AB (ref 11.5–15.5)
WBC: 23 10*3/uL — AB (ref 4.0–10.5)

## 2015-02-12 LAB — BODY FLUID CULTURE
Culture: NO GROWTH
Special Requests: NORMAL

## 2015-02-12 LAB — BASIC METABOLIC PANEL
ANION GAP: 8 (ref 5–15)
BUN: 20 mg/dL (ref 6–20)
CO2: 18 mmol/L — ABNORMAL LOW (ref 22–32)
Calcium: 7.4 mg/dL — ABNORMAL LOW (ref 8.9–10.3)
Chloride: 117 mmol/L — ABNORMAL HIGH (ref 101–111)
Creatinine, Ser: 1.28 mg/dL — ABNORMAL HIGH (ref 0.61–1.24)
GFR, EST NON AFRICAN AMERICAN: 57 mL/min — AB (ref >60)
Glucose, Bld: 94 mg/dL (ref 65–99)
POTASSIUM: 4.6 mmol/L (ref 3.5–5.1)
SODIUM: 143 mmol/L (ref 135–145)

## 2015-02-12 LAB — LACTIC ACID, PLASMA: Lactic Acid, Venous: 2 mmol/L (ref 0.5–2.0)

## 2015-02-12 LAB — GLUCOSE, CAPILLARY
GLUCOSE-CAPILLARY: 35 mg/dL — AB (ref 65–99)
GLUCOSE-CAPILLARY: 63 mg/dL — AB (ref 65–99)
Glucose-Capillary: 93 mg/dL (ref 65–99)

## 2015-02-12 MED ORDER — GLUCOSE 40 % PO GEL
1.0000 | Freq: Once | ORAL | Status: AC
  Administered 2015-02-12: 37.5 g via ORAL

## 2015-02-12 MED ORDER — SODIUM CHLORIDE 0.9 % IJ SOLN
10.0000 mL | INTRAMUSCULAR | Status: DC | PRN
  Administered 2015-02-15 – 2015-02-19 (×6): 10 mL
  Filled 2015-02-12 (×6): qty 40

## 2015-02-12 MED ORDER — GLUCOSE 40 % PO GEL
ORAL | Status: AC
Start: 2015-02-12 — End: 2015-02-12
  Administered 2015-02-12: 37.5 g via ORAL
  Filled 2015-02-12: qty 1

## 2015-02-12 MED ORDER — LORAZEPAM 2 MG/ML IJ SOLN
0.5000 mg | Freq: Once | INTRAMUSCULAR | Status: AC
  Administered 2015-02-12: 0.5 mg via INTRAVENOUS
  Filled 2015-02-12: qty 1

## 2015-02-12 MED ORDER — DEXTROSE 5 % IV SOLN
2.0000 g | Freq: Every day | INTRAVENOUS | Status: DC
  Administered 2015-02-12 – 2015-02-14 (×3): 2 g via INTRAVENOUS
  Filled 2015-02-12 (×3): qty 2

## 2015-02-12 MED ORDER — GLUCOSE 40 % PO GEL
ORAL | Status: AC
  Administered 2015-02-12: 37.5 g
  Filled 2015-02-12: qty 1

## 2015-02-12 MED ORDER — DIPHENHYDRAMINE HCL 25 MG PO CAPS: 25.0000 mg | ORAL_CAPSULE | Freq: Once | ORAL | Status: DC

## 2015-02-12 NOTE — Progress Notes (Signed)
Peripherally Inserted Central Catheter/Midline Placement  The IV Nurse has discussed with the patient and/or persons authorized to consent for the patient, the purpose of this procedure and the potential benefits and risks involved with this procedure.  The benefits include less needle sticks, lab draws from the catheter and patient may be discharged home with the catheter.  Risks include, but not limited to, infection, bleeding, blood clot (thrombus formation), and puncture of an artery; nerve damage and irregular heat beat.  Alternatives to this procedure were also discussed.  PICC/Midline Placement Documentation        Charles [name], Charles [name] 02/12/2015, 3:04 PM

## 2015-02-12 NOTE — Progress Notes (Signed)
   Subjective: Hospital day - 4 Patient reports pain as mild.   Patient seen in rounds earlier today before lunch. Patient is well, but has had some minor complaints of pain in the knee, requiring pain medications but stated that the knee continues to feel better since the aspiration. He wanted to continue to hold on surgery at this time.  Objective: Vital signs in last 24 hours: Temp:  [97.1 F (36.2 C)-98.2 F (36.8 C)] 97.9 F (36.6 C) (11/25 2131) Pulse Rate:  [101-119] 114 (11/25 2131) Resp:  [18-20] 18 (11/25 2131) BP: (95-131)/(56-65) 95/56 mmHg (11/25 2131) SpO2:  [96 %-100 %] 100 % (11/25 2131)  Intake/Output from previous day:  Intake/Output Summary (Last 24 hours) at 02/12/15 2142 Last data filed at 02/12/15 2033  Gross per 24 hour  Intake    840 ml  Output    950 ml  Net   -110 ml    Intake/Output this shift: Total I/O In: -  Out: 550 [Urine:550]  Labs:  Recent Labs  02/10/15 0312 02/12/15 0530  HGB 9.5* 10.1*    Recent Labs  02/10/15 0312 02/12/15 0530  WBC 22.1* 23.0*  RBC 3.35* 3.65*  HCT 29.4* 32.0*  PLT 420* 443*    Recent Labs  02/10/15 0312 02/12/15 0530  NA 143 143  K 4.2 4.6  CL 115* 117*  CO2 22 18*  BUN 30* 20  CREATININE 1.57* 1.28*  GLUCOSE 100* 94  CALCIUM 7.3* 7.4*   No results for input(s): LABPT, INR in the last 72 hours. Culture NO GROWTH 3 DAYS  [facility]       Report Status 02/12/2015 FINAL        EXAM General - Patient is Alert and Appropriate Extremity - Neurovascular intact Sensation intact distally  Small effusion noted. Noted to have multiple skin lesions over body Motor Function - intact, moving foot and toes well on exam.   Past Medical History  Diagnosis Date  . Eczema     Assessment/Plan: Hospital day - 4 Active Problems:   Acute renal failure (ARF) (HCC)   Anemia   Cellulitis   Sepsis (HCC)   UTI (lower urinary tract infection)   Severe sepsis (HCC)  Staphylococcus aureus bacteremia with sepsis (HCC)   Staphylococcal arthritis of right knee (HCC)   Erythroderma   Screen for STD (sexually transmitted disease)  Estimated body mass index is 26.89 kg/(m^2) as calculated from the following:   Height as of this encounter: 5\' 7"  (1.702 m).   Weight as of this encounter: 77.9 kg (171 lb 11.8 oz).  Continue with current treatment plan and see how he does with IV antibiotics. If condition changes, can consider surgery but at this time, no plans since clinical he feels better.  Avel Peacerew Perkins, PA-C Orthopaedic Surgery 02/12/2015, 9:42 PM

## 2015-02-12 NOTE — Progress Notes (Addendum)
[facility]    Date of Admission:  02/08/2015   Total days of antibiotics 5        Day 5 vanco        Day 3 ceftriaxone           ID: Charles [name] is a 64 y.o. male with disseminated MRSA infection to blood, urine, and septic arthritis Active Problems:   Acute renal failure (ARF) (HCC)   Anemia   Cellulitis   Sepsis (HCC)   UTI (lower urinary tract infection)   Severe sepsis (HCC)   Staphylococcus aureus bacteremia with sepsis (HCC)   Staphylococcal arthritis of right knee (HCC)   Erythroderma   Screen for STD (sexually transmitted disease)    Subjective: Afebrile, ong giong right knee pain  ROS: pruritic rash  Medications:  . cefTRIAXone (ROCEPHIN)  IV  2 g Intravenous Q24H  . Chlorhexidine Gluconate Cloth  6 each Topical Q0600  . feeding supplement (ENSURE ENLIVE)  237 mL Oral BID BM  . hydrocerin   Topical BID  . mupirocin ointment  1 application Nasal BID  . sodium chloride  3 mL Intravenous Q12H  . vancomycin  750 mg Intravenous Q12H    Objective: Vital signs in last 24 hours: Temp:  [97.1 F (36.2 C)-98.2 F (36.8 C)] 97.9 F (36.6 C) (11/25 1336) Pulse Rate:  [101-125] 119 (11/25 1336) Resp:  [18-20] 18 (11/25 1336) BP: (111-162)/(56-127) 120/56 mmHg (11/25 1336) SpO2:  [96 %-100 %] 96 % (11/25 1336)  Physical Exam  Constitutional: He is oriented to person, place, sleeping. He appears well-developed and well-nourished. No distress.  HENT:  Mouth/Throat: Oropharynx is clear and moist. No oropharyngeal exudate.  Cardiovascular: Normal rate, regular rhythm and normal heart sounds. Exam reveals no gallop and no friction rub.  No murmur heard.  Pulmonary/Chest: Effort normal and breath sounds normal. No respiratory distress. He has no wheezes.  Abdominal: Soft. Bowel sounds are normal. He exhibits no distension. There is no tenderness.  Lymphadenopathy:  He has no cervical adenopathy.  Neurological: He is alert and oriented  to person, place, and time.  Ext: right knee +effusion and warmth Skin: Skin has marked dryness and excoriation noted from his skin disease Psychiatric: He has a normal mood and affect. His behavior is normal.    Lab Results  Recent Labs  02/10/15 0312 02/12/15 0530  WBC 22.1* 23.0*  HGB 9.5* 10.1*  HCT 29.4* 32.0*  NA 143 143  K 4.2 4.6  CL 115* 117*  CO2 22 18*  BUN 30* 20  CREATININE 1.57* 1.28*   No results found for: ESRSEDRATE, POCTSEDRATE  Microbiology: 11/23 blood cx ngtd 11/22 blood cx x1 ngtd 11/21 synovial cx ngtd 11/21 urine cx + MRSA 11/21 blood cx - strep G + MRSA in 1 set, and diphtheroids in 2nd set  Studies/Results: 11/22 TTE: no veg  Right knee xray:  1. Positive for small suprapatellar knee joint effusion. Septic arthritis should be considered within the differential given the clinical history of fever. Additionally, degenerative and inflammatory effusions are considerations. 2. Mild tricompartmental osteoarthritis.  Assessment/Plan: MRSA bacteremia with disseminated infection = continue with vancomycin, for now minimum of 4 wks due to presumed septic arthritis. Repeat blood cx are NGTD At 48 hrs  Strep bacteremia = would also be covered by vancomycin  Leukocytosis = likely due to poor source control. i would still advocate for patient to undergo I x D for septic arthritis  aki =  improving  Septic arthritis of right knee = recommend to have washout. Will check sed rate and crp  Eczema = appears that still needs much more management by dermatology. As outpatient  [facility] Center[facility], [facility] Cell: [phone] Pager: [phone]  02/12/2015, 2:18 PM

## 2015-02-12 NOTE — Care Management Note (Signed)
Case Management Note  Patient Details  Name: [name] MRN: [id number] Date of Birth: 05/11/1950  Subjective/Objective:PT-recc SNF. CSW following.                    Action/Plan:d/c SNF.   Expected Discharge Date:   (unknown)               Expected Discharge Plan:  Skilled Nursing Facility  In-House Referral:  NA, Clinical Social Work  Discharge planning Services  CM Consult  Post Acute Care Choice:  NA Choice offered to:  NA  DME Arranged:    DME Agency:     HH Arranged:    HH Agency:     Status of Service:  In process, will continue to follow  Medicare Important Message Given:    Date Medicare IM Given:    Medicare IM give by:    Date Additional Medicare IM Given:    Additional Medicare Important Message give by:     If discussed at Long Length of Stay Meetings, dates discussed:    Additional Comments:  Lanier ClamMahabir, Patt Steinhardt, RN 02/12/2015, 4:05 PM

## 2015-02-12 NOTE — Clinical Social Work Note (Signed)
Clinical Social Work Assessment  Patient Details  Name: Charles [name] MRN: [id number] Date of Birth: 09/01/1950  Date of referral:  02/12/15               Reason for consult:  Facility Placement                Permission sought to share information with:  Facility Sport and exercise psychologist, Family Supports Permission granted to share information::  Yes, Verbal Permission Granted  Name::     Wife, Charles [name] [phone]  Agency::   [facility])  Relationship::     Contact Information:     Housing/Transportation Living arrangements for the past 2 months:  Single Family Home Source of Information:  Patient, Spouse Patient Interpreter Needed:  None Criminal Activity/Legal Involvement Pertinent to Current Situation/Hospitalization:  No - Comment as needed Significant Relationships:  Spouse Lives with:    Do you feel safe going back to the place where you live?  No Need for family participation in patient care:  Yes (Comment) (patient requests wife to be decision maker)  Care giving concerns:  Wife states she cannot care for husband at home at this time.   Social Worker assessment / plan:  CSW met with husband to discuss SNF options/placement.  Patient states he wishes for wife to make placement decisions.  CSW contacted wife, Charles [name] [phone] who states she has had family to receive both STR and LTC at [facility] and wishes for spouse to receive therapy there as well.  CSW reached out and made liaison aware of request.  CSW will follow up with wife regarding decision from [facility].    Employment status:  Retired Forensic scientist:  Medicare PT Recommendations:    Information / Referral to community resources:  [place]  Patient/Family's Response to care:  Patient and wife are both agreeable  Patient/Family's Understanding of and Emotional Response to Diagnosis, Current Treatment, and Prognosis:  Wife and husband are both realistic regarding level of care needed for patient  at time of discharge.  Emotional Assessment Appearance:  Appears stated age Attitude/Demeanor/Rapport:   (appropriate) Affect (typically observed):  Accepting, Adaptable Orientation:  Oriented to Self, Oriented to Place, Oriented to  Time, Oriented to Situation Alcohol / Substance use:  Not Applicable Psych involvement (Current and /or in the community):  No (Comment)  Discharge Needs  Concerns to be addressed:  No discharge needs identified Readmission within the last 30 days:  No Current discharge risk:  None Barriers to Discharge:  No Barriers Identified   Dulcy Fanny, LCSW 02/12/2015, 12:34 PM

## 2015-02-12 NOTE — Progress Notes (Signed)
[facility] PROGRESS NOTE  Charles [name] ZOX:[id number] DOB: 08/06/1950 DOA: 02/08/2015 PCP: Geraldo Pitter, MD Interim summary: 64 y.o. male with h/o eczema presented with 24 hours of right knee pain. He was found to be in sepsis from MRSA UTI and right knee infection. He was started on vancomycin and zosyn. Orthopedics consulted.   Assessment/Plan: Sepsis secondary to urinary tract infection with hematuria/possible bacteremia -Upon admission, patient was febrile with tachycardia and tachypnea with leukocytosis -Lactic acid improving,. -UA: Positive nitrites, large leukocytes, TNTC WBC, many bacteria -Urine culture shows MRSA sensitive to vancomyin.  -Blood culture:  GPC and rods, identification and sensitivities pending. Repeat cultures ordered and have been negative so far.  -Echocardiogram showed  Good LVEF , no vegetations seen.  -Continue IVF, vancomycin, Zosyn.  - Auto consult from ID appreciated.   Acute kidney injury  -unknown baseline - improving since admission.   Right knee pain -Given the patient was septic, orthopedics was consulted and appreciated -Patient status post right knee aspiration which showed 66,000 WBC -No growth to date - patient will need knee washout, orthopedics consulted and but patient refused surgery.  - consulted PT and OT, who recommended snf. [facility]  ?Psoriasis  -Patient needs to follow up dermatology as an outpatient -patient has scaly skin, with multiple excoriations   Sinus tachycardia -Likely secondary to sepsis, improved.  -TSH 2.273,   Transient hypotension -resolved with IVF -Likely secondary to sepsis.  Normocytic anemia  -Unknown baseline hemoglobin, currently 9.5 -Continue to monitor CBC -Anemia panel shows iron of 12, ferritin of 80 - stool for occult blood is negative.    Code Status: full code.  Family Communication: none at bedside Disposition Plan: will probably need another 3 days of hospitalization,     Consultants:  Orthopedics.   Procedures:  RIGHT knee aspiration 11/22  Antibiotics:  Vancomycin 11/21  Zosyn 11/21  HPI/Subjective: He reports feeling better.    Objective: Filed Vitals:   02/12/15 0700 02/12/15 1336  BP: 111/65 120/56  Pulse: 101 119  Temp: 97.1 F (36.2 C) 97.9 F (36.6 C)  Resp: 20 18    Intake/Output Summary (Last 24 hours) at 02/12/15 1840 Last data filed at 02/12/15 1837  Gross per 24 hour  Intake   2040 ml  Output    400 ml  Net   1640 ml   Filed Weights   02/08/15 1619 02/08/15 2252  Weight: 70.308 kg (155 lb) 77.9 kg (171 lb 11.8 oz)    Exam:   General:  Alert sitting in the bed.   Cardiovascular: s1s2, tachycardic  Respiratory: clear on auscultation, no wheezing or rhonchi.   Abdomen: soft non tender non distended bowel sounds heard.   Musculoskeletal: right knee swollen, no pedal edema.   Data Reviewed: Basic Metabolic Panel:  Recent Labs Lab 02/08/15 1619 02/09/15 0200 02/10/15 0312 02/12/15 0530  NA 137 141 143 143  K 4.6 4.3 4.2 4.6  CL 106 112* 115* 117*  CO2 22 22 22  18*  GLUCOSE 89 86 100* 94  BUN 22* 25* 30* 20  CREATININE 1.55* 1.75* 1.57* 1.28*  CALCIUM 7.6* 7.4* 7.3* 7.4*  MG  --  2.0  --   --   PHOS  --  3.9  --   --    Liver Function Tests:  Recent Labs Lab 02/08/15 1619 02/09/15 0200  AST 30 40  ALT 21 22  ALKPHOS 101 89  BILITOT 1.5* 1.2  PROT 6.2* 5.5*  ALBUMIN 2.2* 2.0*  No results for input(s): LIPASE, AMYLASE in the last 168 hours. No results for input(s): AMMONIA in the last 168 hours. CBC:  Recent Labs Lab 02/08/15 1619 02/09/15 0200 02/10/15 0312 02/12/15 0530  WBC 21.5* 21.4* 22.1* 23.0*  NEUTROABS 14.2* 12.4*  --   --   HGB 10.7* 9.5* 9.5* 10.1*  HCT 34.3* 29.9* 29.4* 32.0*  MCV 88.2 85.9 87.8 87.7  PLT 388 373 420* 443*   Cardiac Enzymes: No results for input(s): CKTOTAL, CKMB, CKMBINDEX, TROPONINI in the last 168 hours. BNP (last 3 results) No results  for input(s): BNP in the last 8760 hours.  ProBNP (last 3 results) No results for input(s): PROBNP in the last 8760 hours.  CBG:  Recent Labs Lab 02/12/15 0835 02/12/15 0913 02/12/15 1006  GLUCAP 35* 63* 93    Recent Results (from the past 240 hour(s))  Blood Culture (routine x 2)     Status: None   Collection Time: 02/08/15  2:23 PM  Result Value Ref Range Status   Specimen Description BLOOD RIGHT ARM  Final   Special Requests IN PEDIATRIC BOTTLE 3CC  Final   Culture  Setup Time   Final    GRAM POSITIVE RODS AEROBIC BOTTLE ONLY CRITICAL RESULT CALLED TO, READ BACK BY AND VERIFIED WITH: A [facility] RN 2046 02/09/15 A BROWNING    Culture   Final    DIPHTHEROIDS(CORYNEBACTERIUM SPECIES) Standardized susceptibility testing for this organism is not available. Performed at [facility]    Report Status 02/10/2015 FINAL  Final  Blood Culture (routine x 2)     Status: None   Collection Time: 02/08/15  2:31 PM  Result Value Ref Range Status   Specimen Description RIGHT ANTECUBITAL  Final   Special Requests IN PEDIATRIC BOTTLE 3CC  Final   Culture  Setup Time   Final    GRAM POSITIVE COCCI IN CLUSTERS GRAM POSITIVE COCCI IN CHAINS AEROBIC BOTTLE ONLY CRITICAL RESULT CALLED TO, READ BACK BY AND VERIFIED WITH: SCOTT @0715  02/09/15 MKELLY     Culture   Final    STREPTOCOCCUS GROUP C METHICILLIN RESISTANT STAPHYLOCOCCUS AUREUS Performed at [facility]    Report Status 02/12/2015 FINAL  Final   Organism ID, Bacteria STREPTOCOCCUS GROUP C  Final   Organism ID, Bacteria METHICILLIN RESISTANT STAPHYLOCOCCUS AUREUS  Final      Susceptibility   Methicillin resistant staphylococcus aureus - MIC*    CIPROFLOXACIN >=8 RESISTANT Resistant     ERYTHROMYCIN >=8 RESISTANT Resistant     GENTAMICIN <=0.5 SENSITIVE Sensitive     OXACILLIN >=4 RESISTANT Resistant     TETRACYCLINE <=1 SENSITIVE Sensitive     VANCOMYCIN <=0.5 SENSITIVE Sensitive     TRIMETH/SULFA <=10 SENSITIVE  Sensitive     CLINDAMYCIN <=0.25 SENSITIVE Sensitive     RIFAMPIN <=0.5 SENSITIVE Sensitive     Inducible Clindamycin NEGATIVE Sensitive     * METHICILLIN RESISTANT STAPHYLOCOCCUS AUREUS   Streptococcus group c - MIC*    CLINDAMYCIN >=1 RESISTANT Resistant     AMPICILLIN <=0.25 SENSITIVE Sensitive     ERYTHROMYCIN >=8 RESISTANT Resistant     VANCOMYCIN 0.5 SENSITIVE Sensitive     CEFTRIAXONE <=0.12 SENSITIVE Sensitive     LEVOFLOXACIN 0.5 SENSITIVE Sensitive     * STREPTOCOCCUS GROUP C  Urine culture     Status: None   Collection Time: 02/08/15  5:52 PM  Result Value Ref Range Status   Specimen Description URINE, CLEAN CATCH  Final   Special Requests  NONE  Final   Culture   Final    >=100,000 COLONIES/mL METHICILLIN RESISTANT STAPHYLOCOCCUS AUREUS Performed at [facility]    Report Status 02/10/2015 FINAL  Final   Organism ID, Bacteria METHICILLIN RESISTANT STAPHYLOCOCCUS AUREUS  Final      Susceptibility   Methicillin resistant staphylococcus aureus - MIC*    CIPROFLOXACIN >=8 RESISTANT Resistant     GENTAMICIN <=0.5 SENSITIVE Sensitive     NITROFURANTOIN <=16 SENSITIVE Sensitive     OXACILLIN >=4 RESISTANT Resistant     TETRACYCLINE <=1 SENSITIVE Sensitive     VANCOMYCIN 1 SENSITIVE Sensitive     TRIMETH/SULFA <=10 SENSITIVE Sensitive     CLINDAMYCIN <=0.25 SENSITIVE Sensitive     RIFAMPIN <=0.5 SENSITIVE Sensitive     Inducible Clindamycin NEGATIVE Sensitive     * >=100,000 COLONIES/mL METHICILLIN RESISTANT STAPHYLOCOCCUS AUREUS  Body fluid culture     Status: None   Collection Time: 02/08/15  7:42 PM  Result Value Ref Range Status   Specimen Description SYNOVIAL R KNEE  Final   Special Requests Normal  Final   Gram Stain   Final    ABUNDANT WBC PRESENT, PREDOMINANTLY PMN NO ORGANISMS SEEN Gram Stain Report Called to,Read Back By and Verified With: Ulyses Amor [id number] @ 2126 BY J SCOTTON    Culture   Final    NO GROWTH 3 DAYS Performed at [facility]    Report Status 02/12/2015 FINAL  Final  MRSA PCR Screening     Status: Abnormal   Collection Time: 02/08/15 10:41 PM  Result Value Ref Range Status   MRSA by PCR POSITIVE (A) NEGATIVE Final    Comment:        The GeneXpert MRSA Assay (FDA approved for NASAL specimens only), is one component of a comprehensive MRSA colonization surveillance program. It is not intended to diagnose MRSA infection nor to guide or monitor treatment for MRSA infections. RESULT CALLED TO, READ BACK BY AND VERIFIED WITH: CROFTS,S RN [phone] COVINGTON,N   Culture, blood (routine x 2)     Status: None (Preliminary result)   Collection Time: 02/09/15  2:30 PM  Result Value Ref Range Status   Specimen Description BLOOD RIGHT ANTECUBITAL  Final   Special Requests BOTTLES DRAWN AEROBIC AND ANAEROBIC 10CC  Final   Culture   Final    NO GROWTH 3 DAYS Performed at [facility]    Report Status PENDING  Incomplete  Stool culture     Status: None (Preliminary result)   Collection Time: 02/09/15  5:36 PM  Result Value Ref Range Status   Specimen Description STOOL PERIRECTAL  Final   Special Requests Immunocompromised  Final   Culture   Final    ABUNDANT STAPHYLOCOCCUS AUREUS Note: RIFAMPIN AND GENTAMICIN SHOULD NOT BE USED AS SINGLE DRUGS FOR TREATMENT OF STAPH INFECTIONS. Note: REDUCED NORMAL FLORA PRESENT Performed at [facility]    Report Status PENDING  Incomplete  Culture, blood (routine x 2)     Status: None (Preliminary result)   Collection Time: 02/10/15  3:45 PM  Result Value Ref Range Status   Specimen Description BLOOD RIGHT ARM  Final   Special Requests BOTTLES DRAWN AEROBIC AND ANAEROBIC 10 CC EA  Final   Culture   Final    NO GROWTH 2 DAYS Performed at [facility]    Report Status PENDING  Incomplete  Culture, blood (routine x 2)     Status: None (Preliminary result)  Collection Time: 02/10/15  5:24 PM  Result Value Ref Range Status   Specimen  Description BLOOD RIGHT ANTECUBITAL  Final   Special Requests BOTTLES DRAWN AEROBIC AND ANAEROBIC 10CC  Final   Culture   Final    NO GROWTH 2 DAYS Performed at [facility]    Report Status PENDING  Incomplete     Studies: No results found.  Scheduled Meds: . cefTRIAXone (ROCEPHIN)  IV  2 g Intravenous Daily  . Chlorhexidine Gluconate Cloth  6 each Topical Q0600  . feeding supplement (ENSURE ENLIVE)  237 mL Oral BID BM  . hydrocerin   Topical BID  . mupirocin ointment  1 application Nasal BID  . sodium chloride  3 mL Intravenous Q12H  . vancomycin  750 mg Intravenous Q12H   Continuous Infusions: . sodium chloride 100 mL/hr at 02/12/15 1521    Active Problems:   Acute renal failure (ARF) (HCC)   Anemia   Cellulitis   Sepsis (HCC)   UTI (lower urinary tract infection)   Severe sepsis (HCC)   Staphylococcus aureus bacteremia with sepsis (HCC)   Staphylococcal arthritis of right knee (HCC)   Erythroderma   Screen for STD (sexually transmitted disease)    Time spent: 25 minutes    [facility]  [facility] Pager [phone]. If 7PM-7AM, please contact night-coverage at [email or web address], password [facility] 02/12/2015, 6:40 PM  LOS: 4 days

## 2015-02-12 NOTE — Clinical Social Work Note (Signed)
Disposition: [facility] SNF when medically cleared via PTAR.  Charles PennaGina Bransyn Adami, LCSW [phone]  [facility] Licensed Clinical Social Worker

## 2015-02-12 NOTE — Clinical Social Work Placement (Signed)
CLINICAL SOCIAL WORK PLACEMENT  NOTE  Date:  02/12/2015  Patient Details  Name: Charles [name] MRN: [id number] Date of Birth: 1950/05/22  Clinical Social Work is seeking post-discharge placement for this patient at the Skilled  Nursing Facility level of care (*CSW will initial, date and re-position this form in  chart as items are completed):  Yes   Patient/family provided with [facility] Clinical Social Work Department's list of facilities offering this level of care within the geographic area requested by the patient (or if unable, by the patient's family).  Yes   Patient/family informed of their freedom to choose among providers that offer the needed level of care, that participate in Medicare, Medicaid or managed care program needed by the patient, have an available bed and are willing to accept the patient.  Yes   Patient/family informed of [facility]'s ownership interest in [facility] and [facility], as well as of the fact that they are under no obligation to receive care at these facilities.  PASRR submitted to EDS on 02/12/15     PASRR number received on 02/12/15     Existing PASRR number confirmed on       FL2 transmitted to all facilities in geographic area requested by pt/family on 02/12/15     FL2 transmitted to all facilities within larger geographic area on       Patient informed that his/her managed care company has contracts with or will negotiate with certain facilities, including the following:        Yes   Patient/family informed of bed offers received.  Patient chooses bed at [facility]     Physician recommends and patient chooses bed at      Patient to be transferred to [facility] on  .  Patient to be transferred to facility by PTAR     Patient family notified on 02/12/15 of transfer.  Name of family member notified:  wife, Rinaldo Cloud     PHYSICIAN Please sign FL2     Additional Comment:     _______________________________________________ Rondel Baton, LCSW 02/12/2015, 12:37 PM

## 2015-02-12 NOTE — NC FL2 (Signed)
[place] MEDICAID FL2 LEVEL OF CARE SCREENING TOOL     IDENTIFICATION  Patient Name: Charles [name] Birthdate: February 08, 1951 Sex: male Admission Date (Current Location): 02/08/2015  [facility]   Facility and Address:  [facility], [address], [place], [place] [address]      Provider Number: [id number]  Attending Physician Name and Address:  Kathlen ModyVijaya Akula, MD  Relative Name and Phone Number:       Current Level of Care: Hospital Recommended Level of Care: Skilled Nursing Facility Prior Approval Number:    Date Approved/Denied:   PASRR Number: [id number]  Discharge Plan: SNF    Current Diagnoses: Patient Active Problem List   Diagnosis Date Noted  . Staphylococcus aureus bacteremia with sepsis (HCC)   . Staphylococcal arthritis of right knee (HCC)   . Erythroderma   . Screen for STD (sexually transmitted disease)   . Acute renal failure (ARF) (HCC) 02/08/2015  . Anemia 02/08/2015  . Cellulitis 02/08/2015  . Sepsis (HCC) 02/08/2015  . UTI (lower urinary tract infection) 02/08/2015  . Severe sepsis (HCC) 02/08/2015  . Psoriasis   . Sepsis affecting skin     Orientation ACTIVITIES/SOCIAL BLADDER RESPIRATION    Self, Time, Situation, Place    Continent Normal  BEHAVIORAL SYMPTOMS/MOOD NEUROLOGICAL BOWEL NUTRITION STATUS      Continent    PHYSICIAN VISITS COMMUNICATION OF NEEDS Height & Weight Skin    Verbally 5\' 7"  (170.2 cm) 171 lbs. Normal          AMBULATORY STATUS RESPIRATION    Assist independent Normal      Personal Care Assistance Level of Assistance  Bathing, Dressing Bathing Assistance: Limited assistance   Dressing Assistance: Limited assistance      Functional Limitations Info                SPECIAL CARE FACTORS FREQUENCY  PT (By licensed PT), OT (By licensed OT)                   Additional Factors Info  Code Status, Isolation Precautions, Allergies Code Status Info:  FULL Allergies Info: NKA     Isolation Precautions Info: MRSA     Current Medications (02/12/2015): Current Facility-Administered Medications  Medication Dose Route Frequency Provider Last Rate Last Dose  . 0.9 %  sodium chloride infusion   Intravenous Continuous Kathlen ModyVijaya Akula, MD 100 mL/hr at 02/12/15 0400    . acetaminophen (TYLENOL) tablet 650 mg  650 mg Oral Q6H PRN Therisa DoyneAnastassia Doutova, MD       Or  . acetaminophen (TYLENOL) suppository 650 mg  650 mg Rectal Q6H PRN Therisa DoyneAnastassia Doutova, MD      . cefTRIAXone (ROCEPHIN) 2 g in dextrose 5 % 50 mL IVPB  2 g Intravenous Q24H Randall Hissornelius N Van Dam, MD   2 g at 02/11/15 1255  . Chlorhexidine Gluconate Cloth 2 % PADS 6 each  6 each Topical Q0600 Therisa DoyneAnastassia Doutova, MD   6 each at 02/11/15 1034  . diphenhydrAMINE (BENADRYL) capsule 25-50 mg  25-50 mg Oral Q4H PRN Leanne ChangKatherine P Schorr, NP   50 mg at 02/12/15 [id number]  . feeding supplement (ENSURE ENLIVE) (ENSURE ENLIVE) liquid 237 mL  237 mL Oral BID BM Dionne AnoWilliam M Ward, RD   237 mL at 02/11/15 1454  . hydrocerin (EUCERIN) cream   Topical BID [employer] OmahaMaryann Mikhail, DO      . HYDROcodone-acetaminophen (NORCO/VICODIN) 5-325 MG per tablet 1 tablet  1 tablet Oral  Q4H PRN Rolan Lipa, NP   1 tablet at 02/11/15 2300  . morphine 2 MG/ML injection 2 mg  2 mg Intravenous Q3H PRN Therisa Doyne, MD   2 mg at 02/11/15 2352  . mupirocin ointment (BACTROBAN) 2 % 1 application  1 application Nasal BID Therisa Doyne, MD   1 application at 02/11/15 2300  . ondansetron (ZOFRAN) tablet 4 mg  4 mg Oral Q6H PRN Therisa Doyne, MD       Or  . ondansetron (ZOFRAN) injection 4 mg  4 mg Intravenous Q6H PRN Therisa Doyne, MD      . sodium chloride 0.9 % injection 3 mL  3 mL Intravenous Q12H Therisa Doyne, MD   3 mL at 02/11/15 1028  . vancomycin (VANCOCIN) IVPB 750 mg/150 ml premix  750 mg Intravenous Q12H Rollene Fare, RPH   750 mg at 02/11/15 2355   Do not use this list as official medication  orders. Please verify with discharge summary.  Discharge Medications:   Medication List    ASK your doctor about these medications        hydrOXYzine 25 MG tablet  Commonly known as:  ATARAX/VISTARIL  Take 1 tablet (25 mg total) by mouth every 8 (eight) hours as needed.     predniSONE 20 MG tablet  Commonly known as:  DELTASONE  Take 3 tablets (60 mg total) by mouth daily.     triamcinolone cream 0.1 %  Commonly known as:  KENALOG  Apply 1 application topically 2 (two) times daily.        Relevant Imaging Results:  Relevant Lab Results:  Recent Labs SSN [id number]  Additional Information    Rondel Baton, LCSW

## 2015-02-13 ENCOUNTER — Inpatient Hospital Stay (HOSPITAL_COMMUNITY): Payer: Medicare Other

## 2015-02-13 LAB — CBC
HEMATOCRIT: 30 % — AB (ref 39.0–52.0)
HEMOGLOBIN: 9.4 g/dL — AB (ref 13.0–17.0)
MCH: 27.5 pg (ref 26.0–34.0)
MCHC: 31.3 g/dL (ref 30.0–36.0)
MCV: 87.7 fL (ref 78.0–100.0)
Platelets: 418 10*3/uL — ABNORMAL HIGH (ref 150–400)
RBC: 3.42 MIL/uL — AB (ref 4.22–5.81)
RDW: 18.8 % — ABNORMAL HIGH (ref 11.5–15.5)
WBC: 20.3 10*3/uL — ABNORMAL HIGH (ref 4.0–10.5)

## 2015-02-13 LAB — VANCOMYCIN, TROUGH: VANCOMYCIN TR: 11 ug/mL (ref 10.0–20.0)

## 2015-02-13 LAB — BASIC METABOLIC PANEL
ANION GAP: 6 (ref 5–15)
BUN: 22 mg/dL — ABNORMAL HIGH (ref 6–20)
CALCIUM: 7.5 mg/dL — AB (ref 8.9–10.3)
CO2: 21 mmol/L — ABNORMAL LOW (ref 22–32)
Chloride: 116 mmol/L — ABNORMAL HIGH (ref 101–111)
Creatinine, Ser: 1.57 mg/dL — ABNORMAL HIGH (ref 0.61–1.24)
GFR, EST AFRICAN AMERICAN: 51 mL/min — AB (ref >60)
GFR, EST NON AFRICAN AMERICAN: 44 mL/min — AB (ref >60)
Glucose, Bld: 82 mg/dL (ref 65–99)
POTASSIUM: 4.8 mmol/L (ref 3.5–5.1)
Sodium: 143 mmol/L (ref 135–145)

## 2015-02-13 LAB — STOOL CULTURE

## 2015-02-13 MED ORDER — VANCOMYCIN HCL 10 G IV SOLR
1250.0000 mg | Freq: Two times a day (BID) | INTRAVENOUS | Status: DC
  Administered 2015-02-14 – 2015-02-17 (×7): 1250 mg via INTRAVENOUS
  Filled 2015-02-13 (×9): qty 1250

## 2015-02-13 NOTE — Progress Notes (Signed)
Received call from IV team reporting DO NOT USE PICC line.

## 2015-02-13 NOTE — Progress Notes (Signed)
Patient ID: Charles [name], male   DOB: 05-10-1950, 64 y.o.   MRN: [id number]    Subjective:     Patient reports pain as 3 on 0-10 scale.   Denies CP or SOB.  Voiding without difficulty. Positive flatus. Positive BM Pt states his knee has improved since prior to admission doe snot wish to have surgery at this point   Objective: Vital signs in last 24 hours: Temp:  [97.7 F (36.5 C)-97.9 F (36.6 C)] 97.7 F (36.5 C) (11/26 0558) Pulse Rate:  [114-119] 115 (11/26 0558) Resp:  [18] 18 (11/26 0558) BP: (95-120)/(52-56) 107/52 mmHg (11/26 0558) SpO2:  [92 %-100 %] 92 % (11/26 0558)  Intake/Output from previous day: 11/25 0701 - 11/26 0700 In: 840 [P.O.:840] Out: 950 [Urine:950] Intake/Output this shift:    Labs:  Recent Labs  02/12/15 0530 02/13/15 0256  HGB 10.1* 9.4*    Recent Labs  02/12/15 0530 02/13/15 0256  WBC 23.0* 20.3*  RBC 3.65* 3.42*  HCT 32.0* 30.0*  PLT 443* 418*    Recent Labs  02/12/15 0530 02/13/15 0256  NA 143 143  K 4.6 4.8  CL 117* 116*  CO2 18* 21*  BUN 20 22*  CREATININE 1.28* 1.57*  GLUCOSE 94 82  CALCIUM 7.4* 7.5*   No results for input(s): LABPT, INR in the last 72 hours.  Physical Exam: ABD soft Sensation intact distally Dorsiflexion/Plantar flexion intact Compartment soft  Swollen right knee - unable to ROM because of pain.    Assessment/Plan:     up with physical  Therapy Advance diet Continue antibiotics for MRSA Pt is getting discharged to a SNF [facility] today     [facility] Christina for Dr. Venita Lickahari [facility] [phone] 02/13/2015, 8:58 AM

## 2015-02-13 NOTE — Progress Notes (Signed)
02-13-15  1610   IV Team Note;  Pt had a picc line placed yesterday:  Has had continuous weeping from his arm;  Have been unable to keep a sterile dressing over the picc insertion site;  Found picc out to the 8 or 9 cm mark with no dressing on;  Line is positional for obtaining labs;  RN has notified the MD;  Waiting on CXR for tip placement;  Concerned re high risk of infection for this patient;  Is on rocephin and vancomycin at present;    Barkley BrunsLisa Chisa Kushner RN IV Team

## 2015-02-13 NOTE — Progress Notes (Signed)
Notified physician that IV team reports patient may need a stitch at PICC line due to weeping. Physician reports she will follow-up with the request.

## 2015-02-13 NOTE — Progress Notes (Signed)
Notified physician that pt PICC line out approximately 8 cm with IV team at bedside.  Physician ordering chest xray and orders do not use the PICC line.

## 2015-02-13 NOTE — Progress Notes (Addendum)
Pharmacy Antibiotic Time-Out Note  Derryl Harborugene Mellette is a 64 y.o. year-old male admitted on 02/08/2015.  The patient is currently on Vanc and Rocephin for suspected joint infection.  Assessment/Plan:  64 yo male presenting with fever, pain in his right knee. Pt reports he has been seen by a dermatologist and told he has eczema/psoriasis. Pt reports rash and pain have been getting significantly worse.  Vanc trough below target on 750mg  q12h so will increase to 1250mg  q12h and recheck when appropriate.   Lost IV access. RN to let pharmacy know when the next dose is able to be administered so we can time subsequent doses.     Temp (24hrs), Avg:97.7 F (36.5 C), Min:97.5 F (36.4 C), Max:97.9 F (36.6 C)   Recent Labs Lab 02/08/15 1619 02/09/15 0200 02/10/15 0312 02/12/15 0530 02/13/15 0256  WBC 21.5* 21.4* 22.1* 23.0* 20.3*    Recent Labs Lab 02/08/15 1619 02/09/15 0200 02/10/15 0312 02/12/15 0530 02/13/15 0256  CREATININE 1.55* 1.75* 1.57* 1.28* 1.57*   Estimated Creatinine Clearance: 43.3 mL/min (by C-G formula based on Cr of 1.57).   Antimicrobial allergies: None  Antimicrobials this admission: Zosyn 11/21 >> 11/23 Vanc 11/21 >>  Rocephin 11/23 >>  Levels/dose changes this admission: 11/23: increase vanc 750mg  q12h for improved SCr and higher trough goal 11/26: Vanc trough = 11mg /l on 750mg  q12h  Microbiology Results: 11/21 blood: 1 of 2 Group C strep, MRSA  11/21 blood: 1 of 2 Diphtheroids FINAL  11/21 urine: >100k MRSA FINAL  11/21 MRSA PCR: positive  11/21 R knee: abundant WBC, no organisms seen  11/22 blood: ngtd  11/22 stool: abundant staph aureus  11/23 bcx x2 (repeat): ngtd  Thank you for allowing pharmacy to be a part of this patient's care.  Reece Packerickering, Jeniah Kishi Robert PharmD 02/13/2015 5:14 PM

## 2015-02-13 NOTE — Progress Notes (Signed)
Reported to physician that patient continues to scratch and bleed.  Continually encourage patient not to scratch.  Physician reports PICC line may be used for Rocephin, not Vancomycin.  Physician reports tell patient not to scratch.  Will continue to educate and encourage patient not to scratch.

## 2015-02-13 NOTE — Progress Notes (Signed)
[facility] PROGRESS NOTE  Charles [name] ZOX:[id number] DOB: September 17, 1950 DOA: 02/08/2015 PCP: Geraldo Pitter, MD Interim summary: 64 y.o. male with h/o eczema presented with 24 hours of right knee pain. He was found to be in sepsis from MRSA UTI and right knee infection. He was started on vancomycin and zosyn. Orthopedics consulted.   Assessment/Plan: Sepsis secondary to urinary tract infection with hematuria/possible bacteremia -Upon admission, patient was febrile with tachycardia and tachypnea with leukocytosis -Lactic acid elevated on admission and normalized.. -UA: Positive nitrites, large leukocytes, TNTC WBC, many bacteria -Urine culture shows MRSA sensitive to vancomyin.  -Blood culture:  GPC and rods,  One bottle grew MRAS and group c streptococcus.  Repeat cultures ordered and have been negative so far.  -Echocardiogram showed  Good LVEF , no vegetations seen.  -Continue IVF, vancomycin, rocephin - Auto consult from ID appreciated.   Acute kidney injury  -unknown baseline - improving since admission.   Right knee pain -Given the patient was septic, orthopedics was consulted and appreciated -Patient status post right knee aspiration which showed 66,000 WBC -No growth to date - patient will need knee washout, orthopedics consulted and but patient refused surgery.  - consulted PT and OT, who recommended snf. Marland Kitchen  ?Psoriasis  -Patient needs to follow up dermatology as an outpatient -patient has scaly skin, with multiple excoriations   Sinus tachycardia -Likely secondary to sepsis, improved.  -TSH 2.273,.  Transient hypotension -resolved with IVF -Likely secondary to sepsis.  Normocytic anemia  -Unknown baseline hemoglobin, currently 9.5 -Continue to monitor CBC. -Anemia panel shows iron of 12, ferritin of 80. - stool for occult blood is negative.   Poor IV Access:  PICC line put in, but it came out today .  Repeat CXR done.      Code Status: full code.   Family Communication: none at bedside, spoke to his wife over the phone and updated her.  Disposition Plan:  Possible stay till Monday. Needs a new PICC line  Consultants:  Orthopedics.   Procedures:  RIGHT knee aspiration 11/22  Antibiotics:  Vancomycin 11/21  Zosyn 11/21- 11/25  Rocephin 11/25  HPI/Subjective: He denies any other complaints.    Objective: Filed Vitals:   02/13/15 0558 02/13/15 1325  BP: 107/52 135/62  Pulse: 115 99  Temp: 97.7 F (36.5 C) 97.5 F (36.4 C)  Resp: 18 20    Intake/Output Summary (Last 24 hours) at 02/13/15 1735 Last data filed at 02/13/15 1245  Gross per 24 hour  Intake    720 ml  Output   1650 ml  Net   -930 ml   Filed Weights   02/08/15 1619 02/08/15 2252  Weight: 70.308 kg (155 lb) 77.9 kg (171 lb 11.8 oz)    Exam:   General:  Alert sitting in the bed.   Cardiovascular: s1s2, tachycardic  Respiratory: clear on auscultation, no wheezing or rhonchi.   Abdomen: soft non tender non distended bowel sounds heard.   Musculoskeletal: right knee swollen and tender, no pedal edema.   Data Reviewed: Basic Metabolic Panel:  Recent Labs Lab 02/08/15 1619 02/09/15 0200 02/10/15 0312 02/12/15 0530 02/13/15 0256  NA 137 141 143 143 143  K 4.6 4.3 4.2 4.6 4.8  CL 106 112* 115* 117* 116*  CO2 22 22 22  18* 21*  GLUCOSE 89 86 100* 94 82  BUN 22* 25* 30* 20 22*  CREATININE 1.55* 1.75* 1.57* 1.28* 1.57*  CALCIUM 7.6* 7.4* 7.3* 7.4* 7.5*  MG  --  2.0  --   --   --   PHOS  --  3.9  --   --   --    Liver Function Tests:  Recent Labs Lab 02/08/15 1619 02/09/15 0200  AST 30 40  ALT 21 22  ALKPHOS 101 89  BILITOT 1.5* 1.2  PROT 6.2* 5.5*  ALBUMIN 2.2* 2.0*   No results for input(s): LIPASE, AMYLASE in the last 168 hours. No results for input(s): AMMONIA in the last 168 hours. CBC:  Recent Labs Lab 02/08/15 1619 02/09/15 0200 02/10/15 0312 02/12/15 0530 02/13/15 0256  WBC 21.5* 21.4* 22.1* 23.0* 20.3*   NEUTROABS 14.2* 12.4*  --   --   --   HGB 10.7* 9.5* 9.5* 10.1* 9.4*  HCT 34.3* 29.9* 29.4* 32.0* 30.0*  MCV 88.2 85.9 87.8 87.7 87.7  PLT 388 373 420* 443* 418*   Cardiac Enzymes: No results for input(s): CKTOTAL, CKMB, CKMBINDEX, TROPONINI in the last 168 hours. BNP (last 3 results) No results for input(s): BNP in the last 8760 hours.  ProBNP (last 3 results) No results for input(s): PROBNP in the last 8760 hours.  CBG:  Recent Labs Lab 02/12/15 0835 02/12/15 0913 02/12/15 1006  GLUCAP 35* 63* 93    Recent Results (from the past 240 hour(s))  Blood Culture (routine x 2)     Status: None   Collection Time: 02/08/15  2:23 PM  Result Value Ref Range Status   Specimen Description BLOOD RIGHT ARM  Final   Special Requests IN PEDIATRIC BOTTLE 3CC  Final   Culture  Setup Time   Final    GRAM POSITIVE RODS AEROBIC BOTTLE ONLY CRITICAL RESULT CALLED TO, READ BACK BY AND VERIFIED WITH: A [facility] RN 2046 02/09/15 A BROWNING    Culture   Final    DIPHTHEROIDS(CORYNEBACTERIUM SPECIES) Standardized susceptibility testing for this organism is not available. Performed at [facility]    Report Status 02/10/2015 FINAL  Final  Blood Culture (routine x 2)     Status: None   Collection Time: 02/08/15  2:31 PM  Result Value Ref Range Status   Specimen Description RIGHT ANTECUBITAL  Final   Special Requests IN PEDIATRIC BOTTLE 3CC  Final   Culture  Setup Time   Final    GRAM POSITIVE COCCI IN CLUSTERS GRAM POSITIVE COCCI IN CHAINS AEROBIC BOTTLE ONLY CRITICAL RESULT CALLED TO, READ BACK BY AND VERIFIED WITH: SCOTT @0715  02/09/15 MKELLY     Culture   Final    STREPTOCOCCUS GROUP C METHICILLIN RESISTANT STAPHYLOCOCCUS AUREUS Performed at [facility]    Report Status 02/12/2015 FINAL  Final   Organism ID, Bacteria STREPTOCOCCUS GROUP C  Final   Organism ID, Bacteria METHICILLIN RESISTANT STAPHYLOCOCCUS AUREUS  Final      Susceptibility   Methicillin resistant  staphylococcus aureus - MIC*    CIPROFLOXACIN >=8 RESISTANT Resistant     ERYTHROMYCIN >=8 RESISTANT Resistant     GENTAMICIN <=0.5 SENSITIVE Sensitive     OXACILLIN >=4 RESISTANT Resistant     TETRACYCLINE <=1 SENSITIVE Sensitive     VANCOMYCIN <=0.5 SENSITIVE Sensitive     TRIMETH/SULFA <=10 SENSITIVE Sensitive     CLINDAMYCIN <=0.25 SENSITIVE Sensitive     RIFAMPIN <=0.5 SENSITIVE Sensitive     Inducible Clindamycin NEGATIVE Sensitive     * METHICILLIN RESISTANT STAPHYLOCOCCUS AUREUS   Streptococcus group c - MIC*    CLINDAMYCIN >=1 RESISTANT Resistant     AMPICILLIN <=0.25 SENSITIVE Sensitive  ERYTHROMYCIN >=8 RESISTANT Resistant     VANCOMYCIN 0.5 SENSITIVE Sensitive     CEFTRIAXONE <=0.12 SENSITIVE Sensitive     LEVOFLOXACIN 0.5 SENSITIVE Sensitive     * STREPTOCOCCUS GROUP C  Urine culture     Status: None   Collection Time: 02/08/15  5:52 PM  Result Value Ref Range Status   Specimen Description URINE, CLEAN CATCH  Final   Special Requests NONE  Final   Culture   Final    >=100,000 COLONIES/mL METHICILLIN RESISTANT STAPHYLOCOCCUS AUREUS Performed at [facility]    Report Status 02/10/2015 FINAL  Final   Organism ID, Bacteria METHICILLIN RESISTANT STAPHYLOCOCCUS AUREUS  Final      Susceptibility   Methicillin resistant staphylococcus aureus - MIC*    CIPROFLOXACIN >=8 RESISTANT Resistant     GENTAMICIN <=0.5 SENSITIVE Sensitive     NITROFURANTOIN <=16 SENSITIVE Sensitive     OXACILLIN >=4 RESISTANT Resistant     TETRACYCLINE <=1 SENSITIVE Sensitive     VANCOMYCIN 1 SENSITIVE Sensitive     TRIMETH/SULFA <=10 SENSITIVE Sensitive     CLINDAMYCIN <=0.25 SENSITIVE Sensitive     RIFAMPIN <=0.5 SENSITIVE Sensitive     Inducible Clindamycin NEGATIVE Sensitive     * >=100,000 COLONIES/mL METHICILLIN RESISTANT STAPHYLOCOCCUS AUREUS  Body fluid culture     Status: None   Collection Time: 02/08/15  7:42 PM  Result Value Ref Range Status   Specimen Description  SYNOVIAL R KNEE  Final   Special Requests Normal  Final   Gram Stain   Final    ABUNDANT WBC PRESENT, PREDOMINANTLY PMN NO ORGANISMS SEEN Gram Stain Report Called to,Read Back By and Verified With: Ulyses Amor [id number] @ 2126 BY J SCOTTON    Culture   Final    NO GROWTH 3 DAYS Performed at [facility]    Report Status 02/12/2015 FINAL  Final  MRSA PCR Screening     Status: Abnormal   Collection Time: 02/08/15 10:41 PM  Result Value Ref Range Status   MRSA by PCR POSITIVE (A) NEGATIVE Final    Comment:        The GeneXpert MRSA Assay (FDA approved for NASAL specimens only), is one component of a comprehensive MRSA colonization surveillance program. It is not intended to diagnose MRSA infection nor to guide or monitor treatment for MRSA infections. RESULT CALLED TO, READ BACK BY AND VERIFIED WITH: CROFTS,S RN [id number] COVINGTON,N   Culture, blood (routine x 2)     Status: None (Preliminary result)   Collection Time: 02/09/15  2:30 PM  Result Value Ref Range Status   Specimen Description BLOOD RIGHT ANTECUBITAL  Final   Special Requests BOTTLES DRAWN AEROBIC AND ANAEROBIC 10CC  Final   Culture   Final    NO GROWTH 4 DAYS Performed at [facility]    Report Status PENDING  Incomplete  Stool culture     Status: None   Collection Time: 02/09/15  5:36 PM  Result Value Ref Range Status   Specimen Description STOOL PERIRECTAL  Final   Special Requests Immunocompromised  Final   Culture   Final    ABUNDANT STAPHYLOCOCCUS AUREUS Note: RIFAMPIN AND GENTAMICIN SHOULD NOT BE USED AS SINGLE DRUGS FOR TREATMENT OF STAPH INFECTIONS. NO SALMONELLA, SHIGELLA, CAMPYLOBACTER, YERSINIA, OR E.COLI 0157:H7 ISOLATED Note: REDUCED NORMAL FLORA PRESENT Performed at [facility]    Report Status 02/13/2015 FINAL  Final  Culture, blood (routine x 2)     Status: None (  Preliminary result)   Collection Time: 02/10/15  3:45 PM  Result Value Ref Range Status    Specimen Description BLOOD RIGHT ARM  Final   Special Requests BOTTLES DRAWN AEROBIC AND ANAEROBIC 10 CC EA  Final   Culture   Final    NO GROWTH 3 DAYS Performed at [facility]    Report Status PENDING  Incomplete  Culture, blood (routine x 2)     Status: None (Preliminary result)   Collection Time: 02/10/15  5:24 PM  Result Value Ref Range Status   Specimen Description BLOOD RIGHT ANTECUBITAL  Final   Special Requests BOTTLES DRAWN AEROBIC AND ANAEROBIC 10CC  Final   Culture   Final    NO GROWTH 3 DAYS Performed at [facility]    Report Status PENDING  Incomplete     Studies: Dg Chest Port 1 View  02/13/2015  CLINICAL DATA:  PICC line infiltration. EXAM: PORTABLE CHEST 1 VIEW COMPARISON:  02/08/2015 FINDINGS: PICC line with tip in the RIGHT subclavian vein just proximal to the cyst superior vena cava. Normal cardiac silhouette. No effusion, infiltrate, or pneumothorax. IMPRESSION: RIGHT PICC line with tip in the distal RIGHT subclavian vein. Electronically Signed   By: Genevive Bi M.D.   On: 02/13/2015 16:54    Scheduled Meds: . cefTRIAXone (ROCEPHIN)  IV  2 g Intravenous Daily  . feeding supplement (ENSURE ENLIVE)  237 mL Oral BID BM  . hydrocerin   Topical BID  . mupirocin ointment  1 application Nasal BID  . sodium chloride  3 mL Intravenous Q12H  . vancomycin  1,250 mg Intravenous Q12H   Continuous Infusions: . sodium chloride 100 mL/hr at 02/13/15 1502    Active Problems:   Acute renal failure (ARF) (HCC)   Anemia   Cellulitis   Sepsis (HCC)   UTI (lower urinary tract infection)   Severe sepsis (HCC)   Staphylococcus aureus bacteremia with sepsis (HCC)   Staphylococcal arthritis of right knee (HCC)   Erythroderma   Screen for STD (sexually transmitted disease)    Time spent: 25 minutes    [facility]  [facility] Pager [phone]. If 7PM-7AM, please contact night-coverage at [email or web address], password [facility] 02/13/2015, 5:35 PM   LOS: 5 days

## 2015-02-13 NOTE — Progress Notes (Signed)
[facility]    Date of Admission:  02/08/2015   Total days of antibiotics 6        Day 6 vanco        Day 4 ceftriaxone           ID: Charles [name] is a 63 y.o. male with disseminated MRSA infection to blood, urine, and septic arthritis Active Problems:   Acute renal failure (ARF) (HCC)   Anemia   Cellulitis   Sepsis (HCC)   UTI (lower urinary tract infection)   Severe sepsis (HCC)   Staphylococcus aureus bacteremia with sepsis (HCC)   Staphylococcal arthritis of right knee (HCC)   Erythroderma   Screen for STD (sexually transmitted disease)    Subjective: Afebrile, declined surgery.   Interval history : seen by orthopedics and patient declines having surgery, labs showing increase in Cr  Medications:  . cefTRIAXone (ROCEPHIN)  IV  2 g Intravenous Daily  . feeding supplement (ENSURE ENLIVE)  237 mL Oral BID BM  . hydrocerin   Topical BID  . mupirocin ointment  1 application Nasal BID  . sodium chloride  3 mL Intravenous Q12H  . vancomycin  750 mg Intravenous Q12H    Objective: Vital signs in last 24 hours: Temp:  [97.7 F (36.5 C)-97.9 F (36.6 C)] 97.7 F (36.5 C) (11/26 0558) Pulse Rate:  [114-119] 115 (11/26 0558) Resp:  [18] 18 (11/26 0558) BP: (95-120)/(52-56) 107/52 mmHg (11/26 0558) SpO2:  [92 %-100 %] 92 % (11/26 0558)  Physical Exam  Constitutional: He is oriented to person, place, sleeping. He appears well-developed and well-nourished. No distress.  HENT:  Mouth/Throat: Oropharynx is clear and moist. No oropharyngeal exudate.  Cardiovascular: Normal rate, regular rhythm and normal heart sounds. Exam reveals no gallop and no friction rub.  No murmur heard.  Pulmonary/Chest: Effort normal and breath sounds normal. No respiratory distress. He has no wheezes.  Abdominal: Soft. Bowel sounds are normal. He exhibits no distension. There is no tenderness.  Lymphadenopathy:  He has no cervical adenopathy.  Neurological: He is  alert and oriented to person, place, and time.  Ext: right knee +effusion and warmth Skin: Skin has marked dryness and excoriation noted from his skin disease Psychiatric: He has a normal mood and affect. His behavior is normal.    Lab Results  Recent Labs  02/12/15 0530 02/13/15 0256  WBC 23.0* 20.3*  HGB 10.1* 9.4*  HCT 32.0* 30.0*  NA 143 143  K 4.6 4.8  CL 117* 116*  CO2 18* 21*  BUN 20 22*  CREATININE 1.28* 1.57*   No results found for: ESRSEDRATE, POCTSEDRATE  Microbiology: 11/23 blood cx ngtd 11/22 blood cx x1 ngtd 11/21 synovial cx ngtd 11/21 urine cx + MRSA 11/21 blood cx - strep G + MRSA in 1 set, and diphtheroids in 2nd set  Studies/Results: 11/22 TTE: no veg  Right knee xray:  1. Positive for small suprapatellar knee joint effusion. Septic arthritis should be considered within the differential given the clinical history of fever. Additionally, degenerative and inflammatory effusions are considerations. 2. Mild tricompartmental osteoarthritis.  Assessment/Plan: MRSA bacteremia with disseminated infection = continue with vancomycin, plan for 6 wks for complicated bacteremia with presumed septic arthritis. Repeat blood cx are NGTD At 48 hrs, can place picc line  Strep bacteremia = would also be covered by vancomycin  Leukocytosis = likely due to poor source control. Patient is declining to do I x D for septic arthritis  which i think could lead to more complications down the line  aki = increased cr up to 1.5. Would recommend twice a week vanco trough for him  Septic arthritis of right knee = recommend to have washout. Will check sed rate and crp  Eczema = appears that still needs much more management by dermatology. As outpatient       [facility] Antimicrobial Management Team Staphylococcus aureus bacteremia   Staphylococcus aureus bacteremia (SAB) is associated with a high rate of complications and mortality.  Specific aspects of clinical  management are critical to optimizing the outcome of patients with SAB.  Therefore, the [facility] Team [facility]) has initiated an intervention aimed at improving the management of SAB at [facility].  To do so, Infectious Diseases physicians are providing an evidence-based consult for the management of all patients with SAB.     Yes No Comments  Perform follow-up blood cultures (even if the patient is afebrile) to ensure clearance of bacteremia [x]  []         Perform echocardiography to evaluate for endocarditis (transthoracic ECHO is 40-50% sensitive, TEE is > 90% sensitive) [x]  []  tTE only did not show vegetations       Ensure source control []  [x]  Patient declining for right knee wash out, suspect septic arthritis  Investigate for "metastatic" sites of infection [x]  []  Xray of right kne  Change antibiotic therapy to ____vancomycin_____ [x]  []   Vancomycin, goal trough should be 15 - 20 mcg/mL  Estimated duration of IV antibiotic therapy:  6 wks [x]  []  Consult case management for probably prolonged outpatient IV antibiotic therapy     Drue SecondSNIDER, [facility] Cell: [phone] Pager: [phone]  02/13/2015, 10:20 AM

## 2015-02-13 NOTE — Progress Notes (Signed)
Dr. Blake DivineAkula notified of CXR results.  To notify IR of need for PICC.

## 2015-02-14 ENCOUNTER — Inpatient Hospital Stay (HOSPITAL_COMMUNITY): Payer: Medicare Other

## 2015-02-14 DIAGNOSIS — Z789 Other specified health status: Secondary | ICD-10-CM | POA: Diagnosis present

## 2015-02-14 DIAGNOSIS — Z87898 Personal history of other specified conditions: Secondary | ICD-10-CM

## 2015-02-14 LAB — CBC
HEMATOCRIT: 33.3 % — AB (ref 39.0–52.0)
HEMOGLOBIN: 10.2 g/dL — AB (ref 13.0–17.0)
MCH: 27.3 pg (ref 26.0–34.0)
MCHC: 30.6 g/dL (ref 30.0–36.0)
MCV: 89 fL (ref 78.0–100.0)
Platelets: 367 10*3/uL (ref 150–400)
RBC: 3.74 MIL/uL — ABNORMAL LOW (ref 4.22–5.81)
RDW: 18.8 % — AB (ref 11.5–15.5)
WBC: 19.6 10*3/uL — AB (ref 4.0–10.5)

## 2015-02-14 LAB — SEDIMENTATION RATE: Sed Rate: 18 mm/hr — ABNORMAL HIGH (ref 0–16)

## 2015-02-14 LAB — CULTURE, BLOOD (ROUTINE X 2): CULTURE: NO GROWTH

## 2015-02-14 LAB — BASIC METABOLIC PANEL
ANION GAP: 8 (ref 5–15)
BUN: 20 mg/dL (ref 6–20)
CHLORIDE: 116 mmol/L — AB (ref 101–111)
CO2: 21 mmol/L — ABNORMAL LOW (ref 22–32)
Calcium: 7.9 mg/dL — ABNORMAL LOW (ref 8.9–10.3)
Creatinine, Ser: 1.57 mg/dL — ABNORMAL HIGH (ref 0.61–1.24)
GFR calc Af Amer: 51 mL/min — ABNORMAL LOW (ref >60)
GFR calc non Af Amer: 44 mL/min — ABNORMAL LOW (ref >60)
GLUCOSE: 87 mg/dL (ref 65–99)
POTASSIUM: 5 mmol/L (ref 3.5–5.1)
Sodium: 145 mmol/L (ref 135–145)

## 2015-02-14 LAB — C-REACTIVE PROTEIN: CRP: 5.4 mg/dL — AB (ref <1.0)

## 2015-02-14 MED ORDER — LIDOCAINE HCL 1 % IJ SOLN
INTRAMUSCULAR | Status: AC
  Filled 2015-02-14: qty 20

## 2015-02-14 MED ORDER — HYDROXYZINE HCL 25 MG PO TABS
25.0000 mg | ORAL_TABLET | Freq: Three times a day (TID) | ORAL | Status: DC
  Administered 2015-02-14 – 2015-02-21 (×22): 25 mg via ORAL
  Filled 2015-02-14 (×28): qty 1

## 2015-02-14 NOTE — Progress Notes (Signed)
Patient refuses any further attempts at wound care or covering of multiple open areas all over his entire body.  Patient adamantly refuses.

## 2015-02-14 NOTE — Progress Notes (Signed)
RN called to room to find pt out of bed and rushing to the commode.while on the commode pt began excessive scratching to body and PICC site. Assessment of site showed that PICC almost out. STAT order made for IV TEAM to assess and remove.PICC line removed by IV nurse. Will continue to monitor pt.

## 2015-02-14 NOTE — Progress Notes (Signed)
02-14-15  1720   IV Team Note;  Pt had a new tunneled picc placed by Radiology today;  picc that was placed Friday came out due to pt's continual scratching;his skin continues to weep;   Pt has been seen multiple times this shift by his RN, as well as IV Team; dressings ARE NOT staying over the insertion site; picc has been found several times completely open to air, with the pt breathing on the site;  dressings are reinforced, changed continually; very concerned re high risk for infection at the site;  Current dressing wrapped with Kling- around the shoulder and upper arm;  Pt upset, saying he "doesn't want anything more on my skin!"  Please advise with any other suggestions for picc/central line dressings;  Thank you.   Barkley BrunsLisa Valera Vallas RN IV Team

## 2015-02-14 NOTE — Progress Notes (Signed)
[facility] PROGRESS NOTE  Charles [name] RUE:[id number] DOB: June 27, 1950 DOA: 02/08/2015 PCP: Charles Pitter, MD Interim summary: 64 y.o. male with h/o eczema presented with 24 hours of right knee pain. He was found to be in sepsis from MRSA UTI and right knee infection. He was started on vancomycin and zosyn. Orthopedics consulted.   Assessment/Plan: Sepsis secondary to urinary tract infection with hematuria/possible bacteremia -Upon admission, patient was febrile with tachycardia and tachypnea with leukocytosis -Lactic acid elevated on admission and normalized.. -UA: Positive nitrites, large leukocytes, TNTC WBC, many bacteria -Urine culture shows MRSA sensitive to vancomyin.  -Blood culture:  GPC and rods,  One bottle grew MRAS and group c streptococcus.  Repeat cultures ordered and have been negative so far.  -Echocardiogram showed  Good LVEF , no vegetations seen.  -Continue IVF, vancomycin, rocephin - Auto consult from ID appreciated.   Acute kidney injury  -unknown baseline - stabilized at 1.57. Stopped the IV fluids.   Right knee pain -Given the patient was septic, orthopedics was consulted and appreciated -Patient status post right knee aspiration which showed 66,000 WBC -No growth to date - patient will need knee washout, orthopedics consulted and but patient refused surgery.  - consulted PT and OT, who recommended snf. Marland Kitchen  ?Psoriasis  -Patient needs to follow up dermatology as an outpatient -patient has scaly skin, with multiple excoriations , scratching , benadryl works for short period, atarax added with some relief.  - will plan to get in touch with on call dermatology for further recommendations.   Sinus tachycardia -Likely secondary to sepsis, improved.  -TSH 2.273,.  Transient hypotension -resolved with IVF -Likely secondary to sepsis.  Normocytic anemia  -Unknown baseline hemoglobin, stable hemoglobin.  -Continue to monitor CBC. -Anemia panel shows  iron of 12, ferritin of 80. - stool for occult blood is negative.   Poor IV Access:  PICC line put in, but it came out , requested IR to put in a tunnelled PICC line that would stay in place and he would need it for atleast 4 weeks of antibiotics.   Leukocytosis: slowly improving.  Tachycardia; from the sepsis, sinus tachy and improving.       Code Status: full code.  Family Communication: none at bedside, spoke to his wife over the phone and updated her on 11/26.  Disposition Plan:  Pending further improvement. Plan for d/c to SNF, in 1 to 2 days.   Consultants: Orthopedics.  ID  Procedures:  RIGHT knee aspiration 11/22  Antibiotics:  Vancomycin 11/21  Zosyn 11/21- 11/25  Rocephin 11/25  HPI/Subjective: HE was upset with me for telling him not to scratch his skin.    Objective: Filed Vitals:   02/14/15 0613 02/14/15 1453  BP: 112/60 104/58  Pulse: 112 113  Temp: 97.3 F (36.3 C) 97.4 F (36.3 C)  Resp: 20 20    Intake/Output Summary (Last 24 hours) at 02/14/15 1824 Last data filed at 02/14/15 1309  Gross per 24 hour  Intake    480 ml  Output    500 ml  Net    -20 ml   Filed Weights   02/08/15 1619 02/08/15 2252  Weight: 70.308 kg (155 lb) 77.9 kg (171 lb 11.8 oz)    Exam:   General:  Alert sitting in the bed.   Cardiovascular: s1s2, tachycardic  Respiratory: clear on auscultation, no wheezing or rhonchi.   Abdomen: soft non tender non distended bowel sounds heard.   Musculoskeletal: right knee swollen and tender,  no pedal edema.   Data Reviewed: Basic Metabolic Panel:  Recent Labs Lab 02/09/15 0200 02/10/15 0312 02/12/15 0530 02/13/15 0256 02/14/15 0624  NA 141 143 143 143 145  K 4.3 4.2 4.6 4.8 5.0  CL 112* 115* 117* 116* 116*  CO2 22 22 18* 21* 21*  GLUCOSE 86 100* 94 82 87  BUN 25* 30* 20 22* 20  CREATININE 1.75* 1.57* 1.28* 1.57* 1.57*  CALCIUM 7.4* 7.3* 7.4* 7.5* 7.9*  MG 2.0  --   --   --   --   PHOS 3.9  --   --   --    --    Liver Function Tests:  Recent Labs Lab 02/08/15 1619 02/09/15 0200  AST 30 40  ALT 21 22  ALKPHOS 101 89  BILITOT 1.5* 1.2  PROT 6.2* 5.5*  ALBUMIN 2.2* 2.0*   No results for input(s): LIPASE, AMYLASE in the last 168 hours. No results for input(s): AMMONIA in the last 168 hours. CBC:  Recent Labs Lab 02/08/15 1619 02/09/15 0200 02/10/15 0312 02/12/15 0530 02/13/15 0256 02/14/15 0624  WBC 21.5* 21.4* 22.1* 23.0* 20.3* 19.6*  NEUTROABS 14.2* 12.4*  --   --   --   --   HGB 10.7* 9.5* 9.5* 10.1* 9.4* 10.2*  HCT 34.3* 29.9* 29.4* 32.0* 30.0* 33.3*  MCV 88.2 85.9 87.8 87.7 87.7 89.0  PLT 388 373 420* 443* 418* 367   Cardiac Enzymes: No results for input(s): CKTOTAL, CKMB, CKMBINDEX, TROPONINI in the last 168 hours. BNP (last 3 results) No results for input(s): BNP in the last 8760 hours.  ProBNP (last 3 results) No results for input(s): PROBNP in the last 8760 hours.  CBG:  Recent Labs Lab 02/12/15 0835 02/12/15 0913 02/12/15 1006  GLUCAP 35* 63* 93    Recent Results (from the past 240 hour(s))  Blood Culture (routine x 2)     Status: None   Collection Time: 02/08/15  2:23 PM  Result Value Ref Range Status   Specimen Description BLOOD RIGHT ARM  Final   Special Requests IN PEDIATRIC BOTTLE 3CC  Final   Culture  Setup Time   Final    GRAM POSITIVE RODS AEROBIC BOTTLE ONLY CRITICAL RESULT CALLED TO, READ BACK BY AND VERIFIED WITH: A [facility] RN 2046 02/09/15 A BROWNING    Culture   Final    DIPHTHEROIDS(CORYNEBACTERIUM SPECIES) Standardized susceptibility testing for this organism is not available. Performed at [facility]    Report Status 02/10/2015 FINAL  Final  Blood Culture (routine x 2)     Status: None   Collection Time: 02/08/15  2:31 PM  Result Value Ref Range Status   Specimen Description RIGHT ANTECUBITAL  Final   Special Requests IN PEDIATRIC BOTTLE 3CC  Final   Culture  Setup Time   Final    GRAM POSITIVE COCCI IN  CLUSTERS GRAM POSITIVE COCCI IN CHAINS AEROBIC BOTTLE ONLY CRITICAL RESULT CALLED TO, READ BACK BY AND VERIFIED WITH: SCOTT @0715  02/09/15 MKELLY     Culture   Final    STREPTOCOCCUS GROUP C METHICILLIN RESISTANT STAPHYLOCOCCUS AUREUS Performed at [facility]    Report Status 02/12/2015 FINAL  Final   Organism ID, Bacteria STREPTOCOCCUS GROUP C  Final   Organism ID, Bacteria METHICILLIN RESISTANT STAPHYLOCOCCUS AUREUS  Final      Susceptibility   Methicillin resistant staphylococcus aureus - MIC*    CIPROFLOXACIN >=8 RESISTANT Resistant     ERYTHROMYCIN >=8 RESISTANT Resistant  GENTAMICIN <=0.5 SENSITIVE Sensitive     OXACILLIN >=4 RESISTANT Resistant     TETRACYCLINE <=1 SENSITIVE Sensitive     VANCOMYCIN <=0.5 SENSITIVE Sensitive     TRIMETH/SULFA <=10 SENSITIVE Sensitive     CLINDAMYCIN <=0.25 SENSITIVE Sensitive     RIFAMPIN <=0.5 SENSITIVE Sensitive     Inducible Clindamycin NEGATIVE Sensitive     * METHICILLIN RESISTANT STAPHYLOCOCCUS AUREUS   Streptococcus group c - MIC*    CLINDAMYCIN >=1 RESISTANT Resistant     AMPICILLIN <=0.25 SENSITIVE Sensitive     ERYTHROMYCIN >=8 RESISTANT Resistant     VANCOMYCIN 0.5 SENSITIVE Sensitive     CEFTRIAXONE <=0.12 SENSITIVE Sensitive     LEVOFLOXACIN 0.5 SENSITIVE Sensitive     * STREPTOCOCCUS GROUP C  Urine culture     Status: None   Collection Time: 02/08/15  5:52 PM  Result Value Ref Range Status   Specimen Description URINE, CLEAN CATCH  Final   Special Requests NONE  Final   Culture   Final    >=100,000 COLONIES/mL METHICILLIN RESISTANT STAPHYLOCOCCUS AUREUS [facility]    Report Status 02/10/2015 FINAL  Final   Organism ID, Bacteria METHICILLIN RESISTANT STAPHYLOCOCCUS AUREUS  Final      Susceptibility   Methicillin resistant staphylococcus aureus - MIC*    CIPROFLOXACIN >=8 RESISTANT Resistant     GENTAMICIN <=0.5 SENSITIVE Sensitive     NITROFURANTOIN <=16 SENSITIVE Sensitive      OXACILLIN >=4 RESISTANT Resistant     TETRACYCLINE <=1 SENSITIVE Sensitive     VANCOMYCIN 1 SENSITIVE Sensitive     TRIMETH/SULFA <=10 SENSITIVE Sensitive     CLINDAMYCIN <=0.25 SENSITIVE Sensitive     RIFAMPIN <=0.5 SENSITIVE Sensitive     Inducible Clindamycin NEGATIVE Sensitive     * >=100,000 COLONIES/mL METHICILLIN RESISTANT STAPHYLOCOCCUS AUREUS  Body fluid culture     Status: None   Collection Time: 02/08/15  7:42 PM  Result Value Ref Range Status   Specimen Description SYNOVIAL R KNEE  Final   Special Requests Normal  Final   Gram Stain   Final    ABUNDANT WBC PRESENT, PREDOMINANTLY PMN NO ORGANISMS SEEN Gram Stain Report Called to,Read Back By and Verified With: Ulyses AmorERRI DOSTER,RN [id number] @ 2126 BY J SCOTTON    Culture   Final    NO GROWTH 3 DAYS Performed at [facility]    Report Status 02/12/2015 FINAL  Final  MRSA PCR Screening     Status: Abnormal   Collection Time: 02/08/15 10:41 PM  Result Value Ref Range Status   MRSA by PCR POSITIVE (A) NEGATIVE Final    Comment:        The GeneXpert MRSA Assay (FDA approved for NASAL specimens only), is one component of a comprehensive MRSA colonization surveillance program. It is not intended to diagnose MRSA infection nor to guide or monitor treatment for MRSA infections. RESULT CALLED TO, READ BACK BY AND VERIFIED WITH: CROFTS,S RN [id number] COVINGTON,N   Culture, blood (routine x 2)     Status: None   Collection Time: 02/09/15  2:30 PM  Result Value Ref Range Status   Specimen Description BLOOD RIGHT ANTECUBITAL  Final   Special Requests BOTTLES DRAWN AEROBIC AND ANAEROBIC 10CC  Final   Culture   Final    NO GROWTH 5 DAYS Performed at [facility]    Report Status 02/14/2015 FINAL  Final  Stool culture     Status: None   Collection  Time: 02/09/15  5:36 PM  Result Value Ref Range Status   Specimen Description STOOL PERIRECTAL  Final   Special Requests Immunocompromised  Final   Culture   Final     ABUNDANT STAPHYLOCOCCUS AUREUS Note: RIFAMPIN AND GENTAMICIN SHOULD NOT BE USED AS SINGLE DRUGS FOR TREATMENT OF STAPH INFECTIONS. NO SALMONELLA, SHIGELLA, CAMPYLOBACTER, YERSINIA, OR E.COLI 0157:[id number] ISOLATED Note: REDUCED NORMAL FLORA PRESENT Performed at [facility]    Report Status 02/13/2015 FINAL  Final  Culture, blood (routine x 2)     Status: None (Preliminary result)   Collection Time: 02/10/15  3:45 PM  Result Value Ref Range Status   Specimen Description BLOOD RIGHT ARM  Final   Special Requests BOTTLES DRAWN AEROBIC AND ANAEROBIC 10 CC EA  Final   Culture   Final    NO GROWTH 4 DAYS Performed at [facility]    Report Status PENDING  Incomplete  Culture, blood (routine x 2)     Status: None (Preliminary result)   Collection Time: 02/10/15  5:24 PM  Result Value Ref Range Status   Specimen Description BLOOD RIGHT ANTECUBITAL  Final   Special Requests BOTTLES DRAWN AEROBIC AND ANAEROBIC 10CC  Final   Culture   Final    NO GROWTH 4 DAYS Performed at [facility]    Report Status PENDING  Incomplete     Studies: Ir Fluoro Guide Cv Line Right  02/14/2015  CLINICAL DATA:  64 year old male with 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 A recently placed peripheral PICC has become displaced due to inability of the retention bandage to secure the PICC given the patient's underlying severe skin condition. He presents for placement of a tunneled power PICC via the right internal jugular vein. EXAM: IR RIGHT FLOURO GUIDE TUNNELED CV LINE; IR ULTRASOUND GUIDANCE VASC ACCESS RIGHT FLUOROSCOPY TIME:  48 seconds for a total of 3 mGy TECHNIQUE: The right neck and chest were prepped with chlorhexidine, draped in the usual sterile fashion using maximum barrier technique (cap and mask, sterile gown, sterile gloves, large sterile sheet, hand hygiene and cutaneous antiseptic). Local anesthesia was attained by infiltration with 1% lidocaine. Ultrasound  demonstrated patency of the right internal jugular vein, and this was documented with an image. Under real-time ultrasound guidance, this vein was accessed with a 21 gauge micropuncture needle and image documentation was performed. The needle was exchanged over a guidewire for a peel-away sheath. The intravascular length from the IJ puncture site to the superior cavoatrial junction was measured at 17 cm. A suitable skin exit site a relatively disease free segment of skin inferior to the clavicle was selected. Local anesthesia was again attained by infiltration with 1% lidocaine. A small dermatotomy was made. A 5 French single-lumen Power PICC was then tunneled from the skin exit site to the dermatotomy overlying the venous access site. The catheter was then cut to length and passed through the peel-away sheath and positioned at the superior cavoatrial junction. Fluoroscopy during the procedure and fluoro spot radiograph confirms appropriate catheter position. The catheter was flushed, secured to the skin with Prolene sutures, and covered with a sterile dressing. COMPLICATIONS: None.  The patient tolerated the procedure well. IMPRESSION: Successful placement of a right IJ approach tunneled single-lumen Power PICC with sonographic and fluoroscopic guidance. The catheter is ready for use. Signed, Sterling Big, MD [facility] Electronically Signed   By: Malachy Moan M.D.   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 A recently placed peripheral PICC has become displaced due to inability of the retention bandage to secure the PICC given the patient's underlying severe skin condition. He presents for placement of a tunneled power PICC via the right internal jugular vein. EXAM: IR RIGHT FLOURO GUIDE TUNNELED CV LINE; IR ULTRASOUND  GUIDANCE VASC ACCESS RIGHT FLUOROSCOPY TIME:  48 seconds for a total of 3 mGy TECHNIQUE: The right neck and chest were prepped with chlorhexidine, draped in the usual sterile fashion using maximum barrier technique (cap and mask, sterile gown, sterile gloves, large sterile sheet, hand hygiene and cutaneous antiseptic). Local anesthesia was attained by infiltration with 1% lidocaine. Ultrasound demonstrated patency of the right internal jugular vein, and this was documented with an image. Under real-time ultrasound guidance, this vein was accessed with a 21 gauge micropuncture needle and image documentation was performed. The needle was exchanged over a guidewire for a peel-away sheath. The intravascular length from the IJ puncture site to the superior cavoatrial junction was measured at 17 cm. A suitable skin exit site a relatively disease free segment of skin inferior to the clavicle was selected. Local anesthesia was again attained by infiltration with 1% lidocaine. A small dermatotomy was made. A 5 French single-lumen Power PICC was then tunneled from the skin exit site to the dermatotomy overlying the venous access site. The catheter was then cut to length and passed through the peel-away sheath and positioned at the superior cavoatrial junction. Fluoroscopy during the procedure and fluoro spot radiograph confirms appropriate catheter position. The catheter was flushed, secured to the skin with Prolene sutures, and covered with a sterile dressing. COMPLICATIONS: None.  The patient tolerated the procedure well. IMPRESSION: Successful placement of a right IJ approach tunneled single-lumen Power PICC with sonographic and fluoroscopic guidance. The catheter is ready for use. Signed, Sterling Big, MD [facility] Electronically Signed   By: Malachy Moan M.D.   On: 02/14/2015 10:43   Dg Chest Port 1 View  02/13/2015  CLINICAL DATA:  PICC line  infiltration. EXAM: PORTABLE CHEST 1 VIEW COMPARISON:  02/08/2015 FINDINGS: PICC line with tip in the RIGHT subclavian vein just proximal to the cyst superior vena cava. Normal cardiac silhouette. No effusion, infiltrate, or pneumothorax. IMPRESSION: RIGHT PICC line with tip in the distal RIGHT subclavian vein. Electronically Signed   By: Genevive Bi M.D.   On: 02/13/2015 16:54    Scheduled Meds: . feeding supplement (ENSURE ENLIVE)  237 mL Oral BID BM  . hydrocerin   Topical BID  . hydrOXYzine  25 mg Oral TID  . lidocaine      . sodium chloride  3 mL Intravenous Q12H  . vancomycin  1,250 mg Intravenous Q12H   Continuous Infusions: . sodium chloride 100 mL/hr at 02/14/15 1043    Active Problems:   Acute renal failure (ARF) (HCC)   Anemia   Cellulitis   Sepsis (HCC)   UTI (lower urinary tract infection)   Severe sepsis (HCC)   Staphylococcus aureus bacteremia with sepsis (HCC)   Staphylococcal arthritis of right knee (HCC)   Erythroderma   Screen for STD (sexually transmitted disease)    Time spent: 25 minutes    [facility]  [facility] Pager [phone]. If 7PM-7AM, please contact night-coverage at [email or web address], password [facility] 02/14/2015, 6:24 PM  LOS: 6 days

## 2015-02-14 NOTE — Progress Notes (Signed)
Subjective: Reports no complaints, but only mild improvement over all. Continues to not want any surgery intervention for his knee. Denies CP, SOB, or calf pain.   Objective: Vital signs in last 24 hours: Temp:  [97.3 F (36.3 C)-98.1 F (36.7 C)] 97.3 F (36.3 C) (11/27 16100613) Pulse Rate:  [99-115] 112 (11/27 0613) Resp:  [20] 20 (11/27 [id number]) BP: (92-135)/(51-62) 112/60 mmHg (11/27 0613) SpO2:  [96 %-97 %] 97 % (11/27 54090613)  Intake/Output from previous day: 11/26 0701 - 11/27 0700 In: 720 [P.O.:720] Out: 700 [Urine:700] Intake/Output this shift:     Recent Labs  02/12/15 0530 02/13/15 0256 02/14/15 0624  HGB 10.1* 9.4* 10.2*    Recent Labs  02/13/15 0256 02/14/15 0624  WBC 20.3* 19.6*  RBC 3.42* 3.74*  HCT 30.0* 33.3*  PLT 418* 367    Recent Labs  02/13/15 0256 02/14/15 0624  NA 143 145  K 4.8 5.0  CL 116* 116*  CO2 21* 21*  BUN 22* 20  CREATININE 1.57* 1.57*  GLUCOSE 82 87  CALCIUM 7.5* 7.9*   No results for input(s): LABPT, INR in the last 72 hours.  Well nourished. Alert and oriented x3.  Abdomen soft and non tender. Right Calf soft and non tender. Edema to RLE leg comparable to left lower leg. Multiple skin tears. No DVT signs. Compartment soft. No signs of infection.  Right LE grossly neurovascular intact.  Assessment/Plan: Continue non-operative care Po's as tolerated Up with PT Plan to replace IV access in IR ID following for ABX management for MRSA D?C to SNF when ready [facility]   Charles [name] L 02/14/2015, 8:02 AM

## 2015-02-14 NOTE — Procedures (Signed)
Interventional Radiology Procedure Note  Procedure: Placement of a tunneled right IJ single lumen PowerPICC.  Catheter tip at superior cavoatrial jxn and ready for use.   Complications: None  Estimated Blood Loss: 0  Recommendations:  - Routine line care  Signed,  Sterling BigHeath K. Angelyn Osterberg, MD

## 2015-02-14 NOTE — Progress Notes (Signed)
Patient continues to scratch his skin.  Reinforced to patient to NOT scratch.  Patient stops at this time.  Dressing coming off newly inserted PICC line.  IV team consulted.

## 2015-02-14 NOTE — Consult Note (Addendum)
WOC wound consult note Reason for Consult:numerous skin tears, arms and legs weeping. Wound type:numerous skin tears, partial thickness.  All extremities are weeping. Pressure Ulcer POA: No Drainage (amount, consistency, odor) Serous to light yellow drainage. Periwound: intact with evidence of scratching, edeamatous  Dressing procedure/placement/frequency: I will instruct nursing to cover those areas that are on extremities with bismuth and petrolatum gauze (xeroform), top with dry gauze/ABD pads and secure with Kerlix.  Those on trunk may be managed with xeroform gauze, dry gauze and paper tape.  I will provide a therapeutic mattress with low air loss feature for moisture management. WOC nursing team will not follow, but will remain available to this patient, the nursing and medical teams.  Please re-consult if needed. Thanks, Ladona MowLaurie Trennon Torbeck, MSN, RN, GNP, Hans EdenCWOCN, CWON-AP, FAAN  Pager# [phone]

## 2015-02-14 NOTE — Progress Notes (Signed)
[facility]    Date of Admission:  02/08/2015   Total days of antibiotics 7        Day 7 vanco           ID: Charles [name] is a 64 y.o. male with disseminated MRSA infection to blood, urine, and septic arthritis Active Problems:   Acute renal failure (ARF) (HCC)   Anemia   Cellulitis   Sepsis (HCC)   UTI (lower urinary tract infection)   Severe sepsis (HCC)   Staphylococcus aureus bacteremia with sepsis (HCC)   Staphylococcal arthritis of right knee (HCC)   Erythroderma   Screen for STD (sexually transmitted disease)    Subjective: Afebrile, having excessive pruritis, scratched arm and dislocated picc line  Interval history : had picc line placed but subsequently removed since it was dislodged due to scratching his arm. Has a new right ij cv line in place  Medications:  . cefTRIAXone (ROCEPHIN)  IV  2 g Intravenous Daily  . feeding supplement (ENSURE ENLIVE)  237 mL Oral BID BM  . hydrocerin   Topical BID  . lidocaine      . sodium chloride  3 mL Intravenous Q12H  . vancomycin  1,250 mg Intravenous Q12H    Objective: Vital signs in last 24 hours: Temp:  [97.3 F (36.3 C)-98.1 F (36.7 C)] 97.3 F (36.3 C) (11/27 21300613) Pulse Rate:  [99-115] 112 (11/27 0613) Resp:  [20] 20 (11/27 [id number]) BP: (92-135)/(51-62) 112/60 mmHg (11/27 0613) SpO2:  [96 %-97 %] 97 % (11/27 [id number])  Physical Exam  Constitutional: He is oriented to person, place, sleeping. He appears well-developed and well-nourished. No distress.  HENT:  Mouth/Throat: Oropharynx is clear and moist. No oropharyngeal exudate.  Cardiovascular: Normal rate, regular rhythm and normal heart sounds. Exam reveals no gallop and no friction rub.  No murmur heard.  Pulmonary/Chest: Effort normal and breath sounds normal. No respiratory distress. He has no wheezes. Right chest wall has IJ line, but dressing already becoming loose Abdominal: Soft. Bowel sounds are normal. He exhibits no  distension. There is no tenderness.  Lymphadenopathy:  He has no cervical adenopathy.  Neurological: He is alert and oriented to person, place, and time.  Ext: right knee +effusion no warmth Skin: Skin has marked dryness and excoriation noted from his skin disease involving all of his body surfaces Psychiatric: He has a normal mood and affect. His behavior is normal.    Lab Results  Recent Labs  02/13/15 0256 02/14/15 0624  WBC 20.3* 19.6*  HGB 9.4* 10.2*  HCT 30.0* 33.3*  NA 143 145  K 4.8 5.0  CL 116* 116*  CO2 21* 21*  BUN 22* 20  CREATININE 1.57* 1.57*   No results found for: ESRSEDRATE, POCTSEDRATE  Microbiology: 11/23 blood cx ngtd 11/22 blood cx x1 ngtd 11/21 synovial cx ngtd 11/21 urine cx + MRSA 11/21 blood cx - strep G + MRSA in 1 set, and diphtheroids in 2nd set  Studies/Results: 11/22 TTE: no veg  Right knee xray:  1. Positive for small suprapatellar knee joint effusion. Septic arthritis should be considered within the differential given the clinical history of fever. Additionally, degenerative and inflammatory effusions are considerations. 2. Mild tricompartmental osteoarthritis.  Assessment/Plan: MRSA bacteremia with disseminated infection = continue with vancomycin, plan for 6 wks for complicated bacteremia with presumed septic arthritis. Using 11/23 as day 1 of 42.  Strep bacteremia = would also be covered by vancomycin  Leukocytosis =  improving. Patient is declining to do I x D for septic arthritis which i think could lead to more complications down the line  aki = increased cr up to 1.57. Would recommend twice a week vanco trough for him  Septic arthritis of right knee = recommend to have washout, however patient declined surgery and opted for IV therapy alone. Will check sed rate and crp  Eczema = appears that still needs much more management by dermatology as outpatient. Please have wound care come to assess him at bedside to see if current  recommendations make sense  Pruritis = will schedule atarax to see if it improves his symptoms.        [place] [facility] Staphylococcus aureus bacteremia   Staphylococcus aureus bacteremia (SAB) is associated with a high rate of complications and mortality.  Specific aspects of clinical management are critical to optimizing the outcome of patients with SAB.  Therefore, the [facility] Antimicrobial Management Team [facility]) has initiated an intervention aimed at improving the management of SAB at [facility].  To do so, Infectious Diseases physicians are providing an evidence-based consult for the management of all patients with SAB.     Yes No Comments  Perform follow-up blood cultures (even if the patient is afebrile) to ensure clearance of bacteremia          Perform echocardiography to evaluate for endocarditis (transthoracic ECHO is 40-50% sensitive, TEE is > 90% sensitive)   tTE only did not show vegetations       Ensure source control   Patient declining for right knee wash out, suspect septic arthritis  Investigate for "metastatic" sites of infection   Xray of right kne  Change antibiotic therapy to ____vancomycin_____    Vancomycin, goal trough should be 15 - 20 mcg/mL  Estimated duration of IV antibiotic therapy:  6 wks   Consult case management for probably prolonged outpatient IV antibiotic therapy     [facility] for Infectious Diseases Cell: [phone] Pager: [phone]  02/14/2015, 11:00 AM

## 2015-02-15 DIAGNOSIS — L03119 Cellulitis of unspecified part of limb: Secondary | ICD-10-CM | POA: Diagnosis present

## 2015-02-15 DIAGNOSIS — B954 Other streptococcus as the cause of diseases classified elsewhere: Secondary | ICD-10-CM | POA: Diagnosis present

## 2015-02-15 LAB — BASIC METABOLIC PANEL
Anion gap: 7 (ref 5–15)
BUN: 23 mg/dL — AB (ref 6–20)
CALCIUM: 7.7 mg/dL — AB (ref 8.9–10.3)
CHLORIDE: 113 mmol/L — AB (ref 101–111)
CO2: 21 mmol/L — ABNORMAL LOW (ref 22–32)
CREATININE: 1.48 mg/dL — AB (ref 0.61–1.24)
GFR calc Af Amer: 55 mL/min — ABNORMAL LOW (ref >60)
GFR calc non Af Amer: 48 mL/min — ABNORMAL LOW (ref >60)
Glucose, Bld: 87 mg/dL (ref 65–99)
Potassium: 4.6 mmol/L (ref 3.5–5.1)
SODIUM: 141 mmol/L (ref 135–145)

## 2015-02-15 LAB — CULTURE, BLOOD (ROUTINE X 2)
CULTURE: NO GROWTH
Culture: NO GROWTH

## 2015-02-15 MED ORDER — LORAZEPAM 2 MG/ML IJ SOLN
1.0000 mg | INTRAMUSCULAR | Status: DC | PRN
  Administered 2015-02-15 – 2015-02-18 (×10): 1 mg via INTRAVENOUS
  Filled 2015-02-15 (×10): qty 1

## 2015-02-15 NOTE — Consult Note (Signed)
WOC wound consult note Reason for Consult: Nursing continues to reports difficulty with dressings, patient is itching and scratching.  This condition exceeds the scope of WOC nursing.  Recommend consultation with Dermatology (patient has been followed by Dr. Nathanial RancherStuart Taffeen in the past). WOC nursing team will not follow, but will remain available to this patient, the nursing and medical teams.  Please re-consult if needed. Thanks, Ladona MowLaurie Cieara Stierwalt, MSN, RN, GNP, Hans EdenCWOCN, CWON-AP, FAAN  Pager# [phone]

## 2015-02-15 NOTE — Progress Notes (Signed)
Subjective: Still with intense pruritis   Antibiotics:  Anti-infectives    Start     Dose/Rate Route Frequency Ordered Stop   02/13/15 2000  vancomycin (VANCOCIN) 1,250 mg in sodium chloride 0.9 % 250 mL IVPB     1,250 mg 166.7 mL/hr over 90 Minutes Intravenous Every 12 hours 02/13/15 1725     02/12/15 1530  cefTRIAXone (ROCEPHIN) 2 g in dextrose 5 % 50 mL IVPB  Status:  Discontinued     2 g 100 mL/hr over 30 Minutes Intravenous Daily 02/12/15 1514 02/14/15 1402   02/10/15 1230  cefTRIAXone (ROCEPHIN) 2 g in dextrose 5 % 50 mL IVPB  Status:  Discontinued     2 g 100 mL/hr over 30 Minutes Intravenous Every 24 hours 02/10/15 1158 02/12/15 1514   02/10/15 1200  vancomycin (VANCOCIN) IVPB 750 mg/150 ml premix  Status:  Discontinued     750 mg 150 mL/hr over 60 Minutes Intravenous Every 12 hours 02/10/15 1000 02/13/15 1725   02/09/15 1600  vancomycin (VANCOCIN) 1,250 mg in sodium chloride 0.9 % 250 mL IVPB  Status:  Discontinued     1,250 mg 166.7 mL/hr over 90 Minutes Intravenous Every 24 hours 02/08/15 1725 02/09/15 0743   02/09/15 1600  vancomycin (VANCOCIN) IVPB 1000 mg/200 mL premix  Status:  Discontinued     1,000 mg 200 mL/hr over 60 Minutes Intravenous Every 24 hours 02/09/15 0743 02/10/15 1000   02/09/15 0000  piperacillin-tazobactam (ZOSYN) IVPB 3.375 g  Status:  Discontinued     3.375 g 12.5 mL/hr over 240 Minutes Intravenous Every 8 hours 02/08/15 1725 02/10/15 1157   02/08/15 1600  vancomycin (VANCOCIN) IVPB 1000 mg/200 mL premix     1,000 mg 200 mL/hr over 60 Minutes Intravenous  Once 02/08/15 1548 02/08/15 1802   02/08/15 1600  piperacillin-tazobactam (ZOSYN) IVPB 3.375 g     3.375 g 100 mL/hr over 30 Minutes Intravenous  Once 02/08/15 1548 02/08/15 1701      Medications: Scheduled Meds: . feeding supplement (ENSURE ENLIVE)  237 mL Oral BID BM  . hydrocerin   Topical BID  . hydrOXYzine  25 mg Oral TID  . sodium chloride  3 mL Intravenous Q12H  . vancomycin   1,250 mg Intravenous Q12H   Continuous Infusions:  PRN Meds:.acetaminophen **OR** acetaminophen, diphenhydrAMINE, HYDROcodone-acetaminophen, morphine injection, ondansetron **OR** ondansetron (ZOFRAN) IV, sodium chloride    Objective: Weight change:   Intake/Output Summary (Last 24 hours) at 02/15/15 1800 Last data filed at 02/15/15 0900  Gross per 24 hour  Intake    240 ml  Output    250 ml  Net    -10 ml   Blood pressure 90/75, pulse 115, temperature 97.2 F (36.2 C), temperature source Oral, resp. rate 20, height  (1.702 m), weight 171 lb 11.8 oz (77.9 kg), SpO2 90 %. Temp:  [97.2 F (36.2 C)-97.7 F (36.5 C)] 97.2 F (36.2 C) (11/28 0509) Pulse Rate:  [115-119] 115 (11/28 0509) Resp:  [20] 20 (11/28 0509) BP: (90-113)/(50-75) 90/75 mmHg (11/28 0509) SpO2:  [90 %-99 %] 90 % (11/28 0048)  Physical Exam: General: Alert and awake, scaling over scalp,  HEENT: anicteric sclera, pupils reactive to light and accommodation, EOMI CVS regular rate, normal r,  no murmur rubs or gallops Chest: clear to auscultation bilaterally, no wheezing, rales or rhonchi Abdomen: soft nontender, nondistended, normal bowel sounds, Skin:diffuse eczema scaling, right arm wrapped Right knee with effusion: tender Neuro: nonfocal  CBC: CBC Latest Ref  Rng 02/14/2015 02/13/2015 02/12/2015  WBC 4.0 - 10.5 K/uL 19.6(H) 20.3(H) 23.0(H)  Hemoglobin 13.0 - 17.0 g/dL 10.2(L) 9.4(L) 10.1(L)  Hematocrit 39.0 - 52.0 % 33.3(L) 30.0(L) 32.0(L)  Platelets 150 - 400 K/uL 367 418(H) 443(H)       BMET  Recent Labs  02/14/15 0624 02/15/15 0434  NA 145 141  K 5.0 4.6  CL 116* 113*  CO2 21* 21*  GLUCOSE 87 87  BUN 20 23*  CREATININE 1.57* 1.48*  CALCIUM 7.9* 7.7*     Liver Panel  No results for input(s): PROT, ALBUMIN, AST, ALT, ALKPHOS, BILITOT, BILIDIR, IBILI in the last 72 hours.     Sedimentation Rate  Recent Labs  02/14/15 1235  ESRSEDRATE 18*   C-Reactive Protein  Recent  Labs  02/14/15 1235  CRP 5.4*    Micro Results: Recent Results (from the past 720 hour(s))  Blood Culture (routine x 2)     Status: None   Collection Time: 02/08/15  2:23 PM  Result Value Ref Range Status   Specimen Description BLOOD RIGHT ARM  Final   Special Requests IN PEDIATRIC BOTTLE 3CC  Final   Culture  Setup Time   Final    GRAM POSITIVE RODS AEROBIC BOTTLE ONLY CRITICAL RESULT CALLED TO, READ BACK BY AND VERIFIED WITH: A [facility] RN 2046 02/09/15 A BROWNING    Culture   Final    DIPHTHEROIDS(CORYNEBACTERIUM SPECIES) Standardized susceptibility testing for this organism is not available. Performed at [facility]    Report Status 02/10/2015 FINAL  Final  Blood Culture (routine x 2)     Status: None   Collection Time: 02/08/15  2:31 PM  Result Value Ref Range Status   Specimen Description RIGHT ANTECUBITAL  Final   Special Requests IN PEDIATRIC BOTTLE 3CC  Final   Culture  Setup Time   Final    GRAM POSITIVE COCCI IN CLUSTERS GRAM POSITIVE COCCI IN CHAINS AEROBIC BOTTLE ONLY CRITICAL RESULT CALLED TO, READ BACK BY AND VERIFIED WITH: SCOTT  02/09/15 MKELLY     Culture   Final    STREPTOCOCCUS GROUP C METHICILLIN RESISTANT STAPHYLOCOCCUS AUREUS Performed at [facility]    Report Status 02/12/2015 FINAL  Final   Organism ID, Bacteria STREPTOCOCCUS GROUP C  Final   Organism ID, Bacteria METHICILLIN RESISTANT STAPHYLOCOCCUS AUREUS  Final      Susceptibility   Methicillin resistant staphylococcus aureus - MIC*    CIPROFLOXACIN >=8 RESISTANT Resistant     ERYTHROMYCIN >=8 RESISTANT Resistant     GENTAMICIN <=0.5 SENSITIVE Sensitive     OXACILLIN >=4 RESISTANT Resistant     TETRACYCLINE <=1 SENSITIVE Sensitive     VANCOMYCIN <=0.5 SENSITIVE Sensitive     TRIMETH/SULFA <=10 SENSITIVE Sensitive     CLINDAMYCIN <=0.25 SENSITIVE Sensitive     RIFAMPIN <=0.5 SENSITIVE Sensitive     Inducible Clindamycin NEGATIVE Sensitive     * METHICILLIN RESISTANT  STAPHYLOCOCCUS AUREUS   Streptococcus group c - MIC*    CLINDAMYCIN >=1 RESISTANT Resistant     AMPICILLIN <=0.25 SENSITIVE Sensitive     ERYTHROMYCIN >=8 RESISTANT Resistant     VANCOMYCIN 0.5 SENSITIVE Sensitive     CEFTRIAXONE <=0.12 SENSITIVE Sensitive     LEVOFLOXACIN 0.5 SENSITIVE Sensitive     * STREPTOCOCCUS GROUP C  Urine culture     Status: None   Collection Time: 02/08/15  5:52 PM  Result Value Ref Range Status   Specimen Description URINE, CLEAN CATCH  Final  Special Requests NONE  Final   Culture   Final    >=100,000 COLONIES/mL METHICILLIN RESISTANT STAPHYLOCOCCUS AUREUS Performed at [facility]    Report Status 02/10/2015 FINAL  Final   Organism ID, Bacteria METHICILLIN RESISTANT STAPHYLOCOCCUS AUREUS  Final      Susceptibility   Methicillin resistant staphylococcus aureus - MIC*    CIPROFLOXACIN >=8 RESISTANT Resistant     GENTAMICIN <=0.5 SENSITIVE Sensitive     NITROFURANTOIN <=16 SENSITIVE Sensitive     OXACILLIN >=4 RESISTANT Resistant     TETRACYCLINE <=1 SENSITIVE Sensitive     VANCOMYCIN 1 SENSITIVE Sensitive     TRIMETH/SULFA <=10 SENSITIVE Sensitive     CLINDAMYCIN <=0.25 SENSITIVE Sensitive     RIFAMPIN <=0.5 SENSITIVE Sensitive     Inducible Clindamycin NEGATIVE Sensitive     * >=100,000 COLONIES/mL METHICILLIN RESISTANT STAPHYLOCOCCUS AUREUS  Body fluid culture     Status: None   Collection Time: 02/08/15  7:42 PM  Result Value Ref Range Status   Specimen Description SYNOVIAL R KNEE  Final   Special Requests Normal  Final   Gram Stain   Final    ABUNDANT WBC PRESENT, PREDOMINANTLY PMN NO ORGANISMS SEEN Gram Stain Report Called to,Read Back By and Verified With: Ulyses Amor [id number] @ 2126 BY J SCOTTON    Culture   Final    NO GROWTH 3 DAYS Performed at [facility]    Report Status 02/12/2015 FINAL  Final  MRSA PCR Screening     Status: Abnormal   Collection Time: 02/08/15 10:41 PM  Result Value Ref Range Status   MRSA  by PCR POSITIVE (A) NEGATIVE Final    Comment:        The GeneXpert MRSA Assay (FDA approved for NASAL specimens only), is one component of a comprehensive MRSA colonization surveillance program. It is not intended to diagnose MRSA infection nor to guide or monitor treatment for MRSA infections. RESULT CALLED TO, READ BACK BY AND VERIFIED WITH: CROFTS,S RN [phone] COVINGTON,N   Culture, blood (routine x 2)     Status: None   Collection Time: 02/09/15  2:30 PM  Result Value Ref Range Status   Specimen Description BLOOD RIGHT ANTECUBITAL  Final   Special Requests BOTTLES DRAWN AEROBIC AND ANAEROBIC 10CC  Final   Culture   Final    NO GROWTH 5 DAYS Performed at [facility]    Report Status 02/14/2015 FINAL  Final  Stool culture     Status: None   Collection Time: 02/09/15  5:36 PM  Result Value Ref Range Status   Specimen Description STOOL PERIRECTAL  Final   Special Requests Immunocompromised  Final   Culture   Final    ABUNDANT STAPHYLOCOCCUS AUREUS Note: RIFAMPIN AND GENTAMICIN SHOULD NOT BE USED AS SINGLE DRUGS FOR TREATMENT OF STAPH INFECTIONS. NO SALMONELLA, SHIGELLA, CAMPYLOBACTER, YERSINIA, OR E.COLI 0157:H7 ISOLATED Note: REDUCED NORMAL FLORA PRESENT Performed at [facility]    Report Status 02/13/2015 FINAL  Final  Culture, blood (routine x 2)     Status: None   Collection Time: 02/10/15  3:45 PM  Result Value Ref Range Status   Specimen Description BLOOD RIGHT ARM  Final   Special Requests BOTTLES DRAWN AEROBIC AND ANAEROBIC 10 CC EA  Final   Culture   Final    NO GROWTH 5 DAYS Performed at [facility]    Report Status 02/15/2015 FINAL  Final  Culture, blood (routine x 2)  Status: None   Collection Time: 02/10/15  5:24 PM  Result Value Ref Range Status   Specimen Description BLOOD RIGHT ANTECUBITAL  Final   Special Requests BOTTLES DRAWN AEROBIC AND ANAEROBIC 10CC  Final   Culture   Final    NO GROWTH 5 DAYS [facility]    Report Status 02/15/2015 FINAL  Final    Studies/Results: Ir Fluoro Guide Cv Line Right  02/14/2015  CLINICAL DATA:  64 year old male with 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 A recently placed peripheral PICC has become displaced due to inability of the retention bandage to secure the PICC given the patient's underlying severe skin condition. He presents for placement of a tunneled power PICC via the right internal jugular vein. EXAM: IR RIGHT FLOURO GUIDE TUNNELED CV LINE; IR ULTRASOUND GUIDANCE VASC ACCESS RIGHT FLUOROSCOPY TIME:  48 seconds for a total of 3 mGy TECHNIQUE: The right neck and chest were prepped with chlorhexidine, draped in the usual sterile fashion using maximum barrier technique (cap and mask, sterile gown, sterile gloves, large sterile sheet, hand hygiene and cutaneous antiseptic). Local anesthesia was attained by infiltration with 1% lidocaine. Ultrasound demonstrated patency of the right internal jugular vein, and this was documented with an image. Under real-time ultrasound guidance, this vein was accessed with a 21 gauge micropuncture needle and image documentation was performed. The needle was exchanged over a guidewire for a peel-away sheath. The intravascular length from the IJ puncture site to the superior cavoatrial junction was measured at 17 cm. A suitable skin exit site a relatively disease free segment of skin inferior to the clavicle was selected. Local anesthesia was again attained by infiltration with 1% lidocaine. A small dermatotomy was made. A 5 French single-lumen Power PICC was then tunneled from the skin exit site to the dermatotomy overlying the venous access site. The catheter was then cut to length and passed through the peel-away sheath and positioned at the superior cavoatrial junction. Fluoroscopy during the procedure and fluoro spot radiograph confirms appropriate catheter position. The catheter was flushed,  secured to the skin with Prolene sutures, and covered with a sterile dressing. COMPLICATIONS: None.  The patient tolerated the procedure well. IMPRESSION: Successful placement of a right IJ approach tunneled single-lumen Power PICC with sonographic and fluoroscopic guidance. The catheter is ready for use. Signed, Sterling BigHeath K. McCullough, MD [facility] Electronically Signed   By: Malachy MoanHeath  McCullough M.D.   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 A recently placed peripheral PICC has become displaced due to inability of the retention bandage to secure the PICC given the patient's underlying severe skin condition. He presents for placement of a tunneled power PICC via the right internal jugular vein. EXAM: IR RIGHT FLOURO GUIDE TUNNELED CV LINE; IR ULTRASOUND GUIDANCE VASC ACCESS RIGHT FLUOROSCOPY TIME:  48 seconds for a total of 3 mGy TECHNIQUE: The right neck and chest were prepped with chlorhexidine, draped in the usual sterile fashion using maximum barrier technique (cap and mask, sterile gown, sterile gloves, large sterile sheet, hand hygiene and cutaneous antiseptic). Local anesthesia was attained by infiltration with 1% lidocaine. Ultrasound demonstrated patency of the right internal jugular vein, and this was documented with an image. Under real-time ultrasound guidance, this vein was accessed with a 21 gauge micropuncture needle and image documentation was performed. The  needle was exchanged over a guidewire for a peel-away sheath. The intravascular length from the IJ puncture site to the superior cavoatrial junction was measured at 17 cm. A suitable skin exit site a relatively disease free segment of skin inferior to the clavicle was selected. Local anesthesia was again attained by infiltration with 1% lidocaine. A small  dermatotomy was made. A 5 French single-lumen Power PICC was then tunneled from the skin exit site to the dermatotomy overlying the venous access site. The catheter was then cut to length and passed through the peel-away sheath and positioned at the superior cavoatrial junction. Fluoroscopy during the procedure and fluoro spot radiograph confirms appropriate catheter position. The catheter was flushed, secured to the skin with Prolene sutures, and covered with a sterile dressing. COMPLICATIONS: None.  The patient tolerated the procedure well. IMPRESSION: Successful placement of a right IJ approach tunneled single-lumen Power PICC with sonographic and fluoroscopic guidance. The catheter is ready for use. Signed, Sterling Big, MD [facility] Electronically Signed   By: Malachy Moan M.D.   On: 02/14/2015 10:43      Assessment/Plan:  INTERVAL HISTORY:  Patient refused surgery over Thanksgiving Holidays   Active Problems:   Acute renal failure (ARF) (HCC)   Anemia   Cellulitis   Sepsis (HCC)   UTI (lower urinary tract infection)   Severe sepsis (HCC)   Staphylococcus aureus bacteremia with sepsis (HCC)   Staphylococcal arthritis of right knee (HCC)   Erythroderma   Screen for STD (sexually transmitted disease)   Poor intravenous access    Charles [name] is a 64 y.o. male with   Hx of severe eczema admitted with septic shock due to Staphylococcus aureus and Streptococcus with septic knee, urine seeded from blood  #1 MRSAB AND Steptococcal infection  [facility] Management Team Staphylococcus aureus bacteremia   Staphylococcus aureus bacteremia (SAB) is associated with a high rate of complications and mortality. Specific aspects of clinical management are critical to optimizing the outcome of patients with SAB. Therefore, the [facility] Antimicrobial Management Team  [facility]) has initiated an intervention aimed at improving the management of SAB at [facility]. To do so, Infectious Diseases physicians are providing an evidence-based consult for the management of all patients with SAB.     Yes No Comments  Perform follow-up blood cultures (even if the patient is afebrile) to ensure clearance of bacteremia [X]  [ ]  BLOOD CULTURES TAKEN last Wednesday No Growth  Remove vascular catheter and obtain follow-up blood cultures after the removal of the catheter  [ ]  PICC placed after blood cultures were taken  Perform echocardiography to evaluate for endocarditis (transthoracic ECHO is 40-50% sensitive, TEE is > 90% sensitive) [ ]  [ ]  Please keep in mind, that neither test can definitively EXCLUDE endocarditis, and that should clinical suspicion remain high for endocarditis the patient should then still be treated with an "endocarditis" duration of therapy = 6 weeks TTE without vegetations, would get TEE when able  Consult electrophysiologist to evaluate implanted cardiac device (pacemaker, ICD) [ ]  [ ]  NA  Ensure source control [ ]  [ ]  Have all abscesses been drained effectively? Have deep seeded infections (septic joints or osteomyelitis) had appropriate surgical debridement?  The KNEE IS INFECTED AND NEEDS FORMAL SURGERY , And Dr. Victorino Dike is willing to take the patient to the OR.  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 IF HE DOES NOT THIS WILL BE A "TICKING TIME BOMB" THAT COULD PUT HIM BACK IN THE ICU   Investigate for "metastatic" sites of infection [ ]  [ ]  Does the patient have ANY symptom or physical exam finding that would suggest a deeper infection (back or neck pain that may be suggestive of vertebral osteomyelitis or epidural abscess, muscle pain that  could be a symptom of pyomyositis)?  Keep in mind that for deep seeded infections MRI imaging with contrast is preferred rather than other often insensitive tests such as plain x-rays, especially early in a patient's presentation.  SEE PREVIOUS DISCUSSION  Change antibiotic therapy to VANCOMYCIN AND ROCEPHIN [ ]  [ ]  Beta-lactam antibiotics are preferred for MSSA due to higher cure rates.  If on Vancomycin, goal trough should be 15 - 20 mcg/mL  Estimated duration of IV antibiotic therapy: 6 WEEKS [ ]  [ ]  Consult case management for probably prolonged outpatient IV antibiotic therapy   #2 Septic knee : see above and would favor open arthrotomy  #3 MRSA in urine:  seeded from blood not other way around  #4  Diffuse rash: may have erythroderma superimposed onto the chronic eczema: Given severity of his skin involvement I would also consider transfer to [facility] or other academic center for Dermatology expertise. [facility] with their Plastics, Burn and Dermatology expertise would be another option             LOS: 7 days   Acey Lav 02/15/2015, 6:00 PM

## 2015-02-15 NOTE — Progress Notes (Signed)
CSW followed up with Charles [name] at [facility] regarding patient's status. Patient will not be discharging on today. Per Charles [name], bed is still available if there is an anticipated discharged on tomorrow. CSW will continue to follow and provide support while in hospital.   Charles [name], [facility]WA Clinical Social Worker [facility] [phone]

## 2015-02-15 NOTE — Progress Notes (Signed)
02-15-15  0457   Pt found again with central line dsg completely off and site exposed to air. Pt skin continues to weep all over.  Concern remains regarding high risk for infection due to this issue with central line. Thank you, Gilman SchmidtMelissa Delsy Etzkorn RN IV Team

## 2015-02-15 NOTE — Progress Notes (Signed)
At 1745, NT called me to room 1509. Patient had pulled off his new dressings, his gown, his new PICC line dressing, all of his sheets and standing up naked scratching his wounds on his back with a towel. He acted as if he did not understand the severity of what he was doing. His entire bedding, gown, etc. was covered in blood and serous drainage from the multiple wounds.  Quickly placed sterile gauze over PICC line site. Paged MD. New order placed.

## 2015-02-15 NOTE — Care Management Important Message (Signed)
Important Message  Patient Details IM Letter given to Rhonda/Case Manager to present to PatientImportant Message  Patient Details  Name: Charles [name] MRN: [id number] Date of Birth: [birth year]   Medicare Important Message Given:  Yes    Haskell FlirtJamison, Marvel Mcphillips 02/15/2015, 12:34 PM  Name: Charles [name] MRN: [id number] Date of Birth: [birth year]   Medicare Important Message Given:  Yes    Haskell FlirtJamison, Senai Kingsley 02/15/2015, 12:33 PM

## 2015-02-15 NOTE — Progress Notes (Signed)
[facility] PROGRESS NOTE  Charles [name] WJX:[id number] DOB: 07/26/1950 DOA: 02/08/2015 PCP: Geraldo Pitter, MD  HPI/Brief narrative 64 y.o. male with h/o eczema presented with 24 hours of right knee pain. He was found to be in sepsis from MRSA UTI and right knee infection. He was started on vancomycin and zosyn. Orthopedics consulted.   Assessment/Plan: Sepsis secondary to urinary tract infection with hematuria/possible bacteremia -Upon admission, patient was febrile with tachycardia and tachypnea with leukocytosis -Lactic acid was elevated on admission and has since normalized.. -UA: Positive nitrites, large leukocytes, TNTC WBC, many bacteria -Urine culture shows MRSA sensitive to vancomyin.  -Blood culture: GPC and rods, One bottle grew MRAS and group c streptococcus. Repeat cultures ordered and have been negative so far.  -Echocardiogram showed Good LVEF , no vegetations seen.  -Continue IVF, vancomycin, rocephin - Auto consult from ID appreciated. Recs for 6 weeks of vancomycin for presumed septic arthritis  Acute kidney injury  -unknown baseline - stabilized at 1.57. Stopped the IV fluids.  - ID recommends twice weekly vanc trough  Right knee pain -Given the patient was septic, orthopedics was consulted and appreciated -Patient status post right knee aspiration which showed 66,000 WBC -No growth to date - patient will need knee washout, orthopedics consulted and but patient refused surgery.  - consulted PT and OT, who recommended snf. Marland Kitchen  ?Psoriasis  -Patient needs to follow up dermatology as an outpatient -patient has scaly skin, with multiple excoriations , scratching , benadryl works for short period, atarax added with some relief.  - Pt's primary dermatologist was contacted. Per dermatology, patient has a history of noncompliance of treatment x 3 years and was supposed to be on methotrexate with topical triamcinolone. Will discuss w/ ID regarding if  methotrexate can be resumed in the setting of acute infection  Sinus tachycardia -Likely secondary to sepsis, improved.  -TSH 2.273,.  Transient hypotension -resolved with IVF -Likely secondary to sepsis.  Normocytic anemia  -Unknown baseline hemoglobin, stable hemoglobin.  -Continue to monitor CBC. -Anemia panel shows iron of 12, ferritin of 80. - stool for occult blood is negative.   Poor IV Access:  PICC line put in, but it came out , requested IR to put in a tunnelled PICC line that would stay in place and he would need it for atleast 4 weeks of antibiotics.   Leukocytosis: slowly improving.  Tachycardia; from the sepsis, sinus tachy and improving.   Code Status: Full Family Communication: Pt in room Disposition Plan: Pending SNF placement in 24-48hrs   Consultants:  ID  IR  Orthopedic Surgery  Procedures:  RIGHT knee aspiration 11/22  Antibiotics: Anti-infectives    Start     Dose/Rate Route Frequency Ordered Stop   02/13/15 2000  vancomycin (VANCOCIN) 1,250 mg in sodium chloride 0.9 % 250 mL IVPB     1,250 mg 166.7 mL/hr over 90 Minutes Intravenous Every 12 hours 02/13/15 1725     02/12/15 1530  cefTRIAXone (ROCEPHIN) 2 g in dextrose 5 % 50 mL IVPB  Status:  Discontinued     2 g 100 mL/hr over 30 Minutes Intravenous Daily 02/12/15 1514 02/14/15 1402   02/10/15 1230  cefTRIAXone (ROCEPHIN) 2 g in dextrose 5 % 50 mL IVPB  Status:  Discontinued     2 g 100 mL/hr over 30 Minutes Intravenous Every 24 hours 02/10/15 1158 02/12/15 1514   02/10/15 1200  vancomycin (VANCOCIN) IVPB 750 mg/150 ml premix  Status:  Discontinued     750 mg  150 mL/hr over 60 Minutes Intravenous Every 12 hours 02/10/15 1000 02/13/15 1725   02/09/15 1600  vancomycin (VANCOCIN) 1,250 mg in sodium chloride 0.9 % 250 mL IVPB  Status:  Discontinued     1,250 mg 166.7 mL/hr over 90 Minutes Intravenous Every 24 hours 02/08/15 1725 02/09/15 0743   02/09/15 1600  vancomycin (VANCOCIN) IVPB  1000 mg/200 mL premix  Status:  Discontinued     1,000 mg 200 mL/hr over 60 Minutes Intravenous Every 24 hours 02/09/15 0743 02/10/15 1000   02/09/15 0000  piperacillin-tazobactam (ZOSYN) IVPB 3.375 g  Status:  Discontinued     3.375 g 12.5 mL/hr over 240 Minutes Intravenous Every 8 hours 02/08/15 1725 02/10/15 1157   02/08/15 1600  vancomycin (VANCOCIN) IVPB 1000 mg/200 mL premix     1,000 mg 200 mL/hr over 60 Minutes Intravenous  Once 02/08/15 1548 02/08/15 1802   02/08/15 1600  piperacillin-tazobactam (ZOSYN) IVPB 3.375 g     3.375 g 100 mL/hr over 30 Minutes Intravenous  Once 02/08/15 1548 02/08/15 1701      HPI/Subjective: Patient is without complaints  Objective: Filed Vitals:   02/14/15 1453 02/14/15 2058 02/15/15 0048 02/15/15 0509  BP: 104/58 100/50 113/52 90/75  Pulse: 113 119 117 115  Temp: 97.4 F (36.3 C) 97.7 F (36.5 C) 97.2 F (36.2 C) 97.2 F (36.2 C)  TempSrc: Oral Oral Oral Oral  Resp: 20 20 20 20   Height:      Weight:      SpO2: 93% 99% 90%     Intake/Output Summary (Last 24 hours) at 02/15/15 1834 Last data filed at 02/15/15 0900  Gross per 24 hour  Intake    240 ml  Output    250 ml  Net    -10 ml   Filed Weights   02/08/15 1619 02/08/15 2252  Weight: 70.308 kg (155 lb) 77.9 kg (171 lb 11.8 oz)    Exam:   General:  Awake, in nad  Cardiovascular: regular, s1, s2  Respiratory: normal resp effort, no wheezing  Abdomen: soft,nondistended  Musculoskeletal: perfused, no clubbing   Data Reviewed: Basic Metabolic Panel:  Recent Labs Lab 02/09/15 0200 02/10/15 0312 02/12/15 0530 02/13/15 0256 02/14/15 0624 02/15/15 0434  NA 141 143 143 143 145 141  K 4.3 4.2 4.6 4.8 5.0 4.6  CL 112* 115* 117* 116* 116* 113*  CO2 22 22 18* 21* 21* 21*  GLUCOSE 86 100* 94 82 87 87  BUN 25* 30* 20 22* 20 23*  CREATININE 1.75* 1.57* 1.28* 1.57* 1.57* 1.48*  CALCIUM 7.4* 7.3* 7.4* 7.5* 7.9* 7.7*  MG 2.0  --   --   --   --   --   PHOS 3.9  --   --    --   --   --    Liver Function Tests:  Recent Labs Lab 02/09/15 0200  AST 40  ALT 22  ALKPHOS 89  BILITOT 1.2  PROT 5.5*  ALBUMIN 2.0*   No results for input(s): LIPASE, AMYLASE in the last 168 hours. No results for input(s): AMMONIA in the last 168 hours. CBC:  Recent Labs Lab 02/09/15 0200 02/10/15 0312 02/12/15 0530 02/13/15 0256 02/14/15 0624  WBC 21.4* 22.1* 23.0* 20.3* 19.6*  NEUTROABS 12.4*  --   --   --   --   HGB 9.5* 9.5* 10.1* 9.4* 10.2*  HCT 29.9* 29.4* 32.0* 30.0* 33.3*  MCV 85.9 87.8 87.7 87.7 89.0  PLT 373 420* 443* 418*  367   Cardiac Enzymes: No results for input(s): CKTOTAL, CKMB, CKMBINDEX, TROPONINI in the last 168 hours. BNP (last 3 results) No results for input(s): BNP in the last 8760 hours.  ProBNP (last 3 results) No results for input(s): PROBNP in the last 8760 hours.  CBG:  Recent Labs Lab 02/12/15 0835 02/12/15 0913 02/12/15 1006  GLUCAP 35* 63* 93    Recent Results (from the past 240 hour(s))  Blood Culture (routine x 2)     Status: None   Collection Time: 02/08/15  2:23 PM  Result Value Ref Range Status   Specimen Description BLOOD RIGHT ARM  Final   Special Requests IN PEDIATRIC BOTTLE 3CC  Final   Culture  Setup Time   Final    GRAM POSITIVE RODS AEROBIC BOTTLE ONLY CRITICAL RESULT CALLED TO, READ BACK BY AND VERIFIED WITH: [facility] 2046 02/09/15 A BROWNING    Culture   Final    DIPHTHEROIDS(CORYNEBACTERIUM SPECIES) Standardized susceptibility testing for this organism is not available. [facility]    Report Status 02/10/2015 FINAL  Final  Blood Culture (routine x 2)     Status: None   Collection Time: 02/08/15  2:31 PM  Result Value Ref Range Status   Specimen Description RIGHT ANTECUBITAL  Final   Special Requests IN PEDIATRIC BOTTLE 3CC  Final   Culture  Setup Time   Final    GRAM POSITIVE COCCI IN CLUSTERS GRAM POSITIVE COCCI IN CHAINS AEROBIC BOTTLE ONLY CRITICAL RESULT CALLED TO,  READ BACK BY AND VERIFIED WITH: SCOTT @0715  02/09/15 MKELLY     Culture   Final    STREPTOCOCCUS GROUP C METHICILLIN RESISTANT STAPHYLOCOCCUS AUREUS Per[facility]    Report Status 02/12/2015 FINAL  Final   Organism ID, Bacteria STREPTOCOCCUS GROUP C  Final   Organism ID, Bacteria METHICILLIN RESISTANT STAPHYLOCOCCUS AUREUS  Final      Susceptibility   Methicillin resistant staphylococcus aureus - MIC*    CIPROFLOXACIN >=8 RESISTANT Resistant     ERYTHROMYCIN >=8 RESISTANT Resistant     GENTAMICIN <=0.5 SENSITIVE Sensitive     OXACILLIN >=4 RESISTANT Resistant     TETRACYCLINE <=1 SENSITIVE Sensitive     VANCOMYCIN <=0.5 SENSITIVE Sensitive     TRIMETH/SULFA <=10 SENSITIVE Sensitive     CLINDAMYCIN <=0.25 SENSITIVE Sensitive     RIFAMPIN <=0.5 SENSITIVE Sensitive     Inducible Clindamycin NEGATIVE Sensitive     * METHICILLIN RESISTANT STAPHYLOCOCCUS AUREUS   Streptococcus group c - MIC*    CLINDAMYCIN >=1 RESISTANT Resistant     AMPICILLIN <=0.25 SENSITIVE Sensitive     ERYTHROMYCIN >=8 RESISTANT Resistant     VANCOMYCIN 0.5 SENSITIVE Sensitive     CEFTRIAXONE <=0.12 SENSITIVE Sensitive     LEVOFLOXACIN 0.5 SENSITIVE Sensitive     * STREPTOCOCCUS GROUP C  Urine culture     Status: None   Collection Time: 02/08/15  5:52 PM  Result Value Ref Range Status   Specimen Description URINE, CLEAN CATCH  Final   Special Requests NONE  Final   Culture   Final    >=100,000 COLONIES/mL METHICILLIN RESISTANT STAPHYLOCOCCUS AUREUS Performed at [facility]    Report Status 02/10/2015 FINAL  Final   Organism ID, Bacteria METHICILLIN RESISTANT STAPHYLOCOCCUS AUREUS  Final      Susceptibility   Methicillin resistant staphylococcus aureus - MIC*    CIPROFLOXACIN >=8 RESISTANT Resistant     GENTAMICIN <=0.5 SENSITIVE Sensitive  NITROFURANTOIN <=16 SENSITIVE Sensitive     OXACILLIN >=4 RESISTANT Resistant     TETRACYCLINE <=1 SENSITIVE Sensitive     VANCOMYCIN  1 SENSITIVE Sensitive     TRIMETH/SULFA <=10 SENSITIVE Sensitive     CLINDAMYCIN <=0.25 SENSITIVE Sensitive     RIFAMPIN <=0.5 SENSITIVE Sensitive     Inducible Clindamycin NEGATIVE Sensitive     * >=100,000 COLONIES/mL METHICILLIN RESISTANT STAPHYLOCOCCUS AUREUS  Body fluid culture     Status: None   Collection Time: 02/08/15  7:42 PM  Result Value Ref Range Status   Specimen Description SYNOVIAL R KNEE  Final   Special Requests Normal  Final   Gram Stain   Final    ABUNDANT WBC PRESENT, PREDOMINANTLY PMN NO ORGANISMS SEEN Gram Stain Report Called to,Read Back By and Verified With: Ulyses Amor [id number] @ 2126 BY J SCOTTON    Culture   Final    NO GROWTH 3 DAYS Performed at [facility]    Report Status 02/12/2015 FINAL  Final  MRSA PCR Screening     Status: Abnormal   Collection Time: 02/08/15 10:41 PM  Result Value Ref Range Status   MRSA by PCR POSITIVE (A) NEGATIVE Final    Comment:        The GeneXpert MRSA Assay (FDA approved for NASAL specimens only), is one component of a comprehensive MRSA colonization surveillance program. It is not intended to diagnose MRSA infection nor to guide or monitor treatment for MRSA infections. RESULT CALLED TO, READ BACK BY AND VERIFIED WITH: CROFTS,S RN [phone] COV[place],N   Culture, blood (routine x 2)     Status: None   Collection Time: 02/09/15  2:30 PM  Result Value Ref Range Status   Specimen Description BLOOD RIGHT ANTECUBITAL  Final   Special Requests BOTTLES DRAWN AEROBIC AND ANAEROBIC 10CC  Final   Culture   Final    NO GROWTH 5 DAYS Performed at [facility]    Report Status 02/14/2015 FINAL  Final  Stool culture     Status: None   Collection Time: 02/09/15  5:36 PM  Result Value Ref Range Status   Specimen Description STOOL PERIRECTAL  Final   Special Requests Immunocompromised  Final   Culture   Final    ABUNDANT STAPHYLOCOCCUS AUREUS Note: RIFAMPIN AND GENTAMICIN SHOULD NOT BE USED AS  SINGLE DRUGS FOR TREATMENT OF STAPH INFECTIONS. NO SALMONELLA, SHIGELLA, CAMPYLOBACTER, YERSINIA, OR E.COLI 0157:H7 ISOLATED Note: REDUCED NORMAL FLORA PRESENT Performed at [facility]    Report Status 02/13/2015 FINAL  Final  Culture, blood (routine x 2)     Status: None   Collection Time: 02/10/15  3:45 PM  Result Value Ref Range Status   Specimen Description BLOOD RIGHT ARM  Final   Special Requests BOTTLES DRAWN AEROBIC AND ANAEROBIC 10 CC EA  Final   Culture   Final    NO GROWTH 5 DAYS Performed at [facility]    Report Status 02/15/2015 FINAL  Final  Culture, blood (routine x 2)     Status: None   Collection Time: 02/10/15  5:24 PM  Result Value Ref Range Status   Specimen Description BLOOD RIGHT ANTECUBITAL  Final   Special Requests BOTTLES DRAWN AEROBIC AND ANAEROBIC 10CC  Final   Culture   Final    NO GROWTH 5 DAYS Performed at [facility]    Report Status 02/15/2015 FINAL  Final     Studies: Ir Fluoro Guide Cv Line  Right  02/14/2015  CLINICAL DATA:  64 year old male with 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 A recently placed peripheral PICC has become displaced due to inability of the retention bandage to secure the PICC given the patient's underlying severe skin condition. He presents for placement of a tunneled power PICC via the right internal jugular vein. EXAM: IR RIGHT FLOURO GUIDE TUNNELED CV LINE; IR ULTRASOUND GUIDANCE VASC ACCESS RIGHT FLUOROSCOPY TIME:  48 seconds for a total of 3 mGy TECHNIQUE: The right neck and chest were prepped with chlorhexidine, draped in the usual sterile fashion using maximum barrier technique (cap and mask, sterile gown, sterile gloves, large sterile sheet, hand hygiene and cutaneous antiseptic). Local anesthesia was attained by infiltration with 1% lidocaine. Ultrasound demonstrated patency of the right internal jugular vein, and this was documented with an image. Under real-time ultrasound  guidance, this vein was accessed with a 21 gauge micropuncture needle and image documentation was performed. The needle was exchanged over a guidewire for a peel-away sheath. The intravascular length from the IJ puncture site to the superior cavoatrial junction was measured at 17 cm. A suitable skin exit site a relatively disease free segment of skin inferior to the clavicle was selected. Local anesthesia was again attained by infiltration with 1% lidocaine. A small dermatotomy was made. A 5 French single-lumen Power PICC was then tunneled from the skin exit site to the dermatotomy overlying the venous access site. The catheter was then cut to length and passed through the peel-away sheath and positioned at the superior cavoatrial junction. Fluoroscopy during the procedure and fluoro spot radiograph confirms appropriate catheter position. The catheter was flushed, secured to the skin with Prolene sutures, and covered with a sterile dressing. COMPLICATIONS: None.  The patient tolerated the procedure well. IMPRESSION: Successful placement of a right IJ approach tunneled single-lumen Power PICC with sonographic and fluoroscopic guidance. The catheter is ready for use. Signed, Sterling Big, MD [facility] Electronically Signed   By: Malachy Moan M.D.   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 A recently placed peripheral PICC has become displaced due to inability of the retention bandage to secure the PICC given the patient's underlying severe skin condition. He presents for placement of a tunneled power PICC via the right internal jugular vein. EXAM: IR RIGHT FLOURO GUIDE TUNNELED CV LINE; IR ULTRASOUND GUIDANCE VASC ACCESS RIGHT FLUOROSCOPY TIME:  48 seconds for a total of 3 mGy TECHNIQUE: The right neck and chest were  prepped with chlorhexidine, draped in the usual sterile fashion using maximum barrier technique (cap and mask, sterile gown, sterile gloves, large sterile sheet, hand hygiene and cutaneous antiseptic). Local anesthesia was attained by infiltration with 1% lidocaine. Ultrasound demonstrated patency of the right internal jugular vein, and this was documented with an image. Under real-time ultrasound guidance, this vein was accessed with a 21 gauge micropuncture needle and image documentation was performed. The needle was exchanged over a guidewire for a peel-away sheath. The intravascular length from the IJ puncture site to the superior cavoatrial junction was measured at 17 cm. A suitable skin exit site a relatively disease free segment of skin inferior to the clavicle was selected. Local anesthesia was again attained by infiltration with 1% lidocaine. A small dermatotomy was made. A 5 French single-lumen Power PICC was then tunneled from the skin  exit site to the dermatotomy overlying the venous access site. The catheter was then cut to length and passed through the peel-away sheath and positioned at the superior cavoatrial junction. Fluoroscopy during the procedure and fluoro spot radiograph confirms appropriate catheter position. The catheter was flushed, secured to the skin with Prolene sutures, and covered with a sterile dressing. COMPLICATIONS: None.  The patient tolerated the procedure well. IMPRESSION: Successful placement of a right IJ approach tunneled single-lumen Power PICC with sonographic and fluoroscopic guidance. The catheter is ready for use. Signed, Sterling Big, MD [facility] Rad[facility] Electronically Signed   By: Malachy Moan M.D.   On: 02/14/2015 10:43    Scheduled Meds: . feeding supplement (ENSURE ENLIVE)  237 mL Oral BID BM  . hydrocerin   Topical BID  . hydrOXYzine  25 mg Oral TID  . sodium chloride  3 mL Intravenous Q12H  .  vancomycin  1,250 mg Intravenous Q12H   Continuous Infusions:   Active Problems:   Acute renal failure (ARF) (HCC)   Anemia   Cellulitis   Sepsis (HCC)   UTI (lower urinary tract infection)   Severe sepsis (HCC)   Staphylococcus aureus bacteremia with sepsis (HCC)   Staphylococcal arthritis of right knee (HCC)   Erythroderma   Screen for STD (sexually transmitted disease)   Poor intravenous access   Streptococcal infection group G   CHIU, STEPHEN K  [facility] Pager [phone]. If 7PM-7AM, please contact night-coverage at [email or web address], password [facility] 02/15/2015, 6:34 PM  LOS: 7 days

## 2015-02-15 NOTE — Progress Notes (Signed)
Date: February 15, 2015 Chart reviewed for concurrent status and case management needs. Will continue to follow patient for changes and needs: Karisha Marlin, RN, BSN, CCM   [phone] 

## 2015-02-15 NOTE — Progress Notes (Signed)
Please refer to pictures in H&P note on 11/21. Today, skin appears to have exponentially more open and weeping areas than the pictures taken on admission.

## 2015-02-15 NOTE — Progress Notes (Signed)
Placed Xeroform, ABD pads & kerlix to bilateral upper extremities at 1325. Patient stated that it made the wounds  On his arms feel much better.

## 2015-02-16 LAB — CBC
HCT: 29.8 % — ABNORMAL LOW (ref 39.0–52.0)
HEMOGLOBIN: 9.2 g/dL — AB (ref 13.0–17.0)
MCH: 27 pg (ref 26.0–34.0)
MCHC: 30.9 g/dL (ref 30.0–36.0)
MCV: 87.4 fL (ref 78.0–100.0)
PLATELETS: 343 10*3/uL (ref 150–400)
RBC: 3.41 MIL/uL — AB (ref 4.22–5.81)
RDW: 19 % — ABNORMAL HIGH (ref 11.5–15.5)
WBC: 25.1 10*3/uL — AB (ref 4.0–10.5)

## 2015-02-16 LAB — BASIC METABOLIC PANEL
ANION GAP: 7 (ref 5–15)
BUN: 24 mg/dL — ABNORMAL HIGH (ref 6–20)
CALCIUM: 7.7 mg/dL — AB (ref 8.9–10.3)
CHLORIDE: 113 mmol/L — AB (ref 101–111)
CO2: 24 mmol/L (ref 22–32)
CREATININE: 1.62 mg/dL — AB (ref 0.61–1.24)
GFR calc Af Amer: 49 mL/min — ABNORMAL LOW (ref >60)
GFR calc non Af Amer: 43 mL/min — ABNORMAL LOW (ref >60)
Glucose, Bld: 95 mg/dL (ref 65–99)
Potassium: 5 mmol/L (ref 3.5–5.1)
SODIUM: 144 mmol/L (ref 135–145)

## 2015-02-16 LAB — C DIFFICILE QUICK SCREEN W PCR REFLEX
C DIFFICILE (CDIFF) INTERP: NEGATIVE
C Diff antigen: NEGATIVE
C Diff toxin: NEGATIVE

## 2015-02-16 MED ORDER — FOLIC ACID 1 MG PO TABS
1.0000 mg | ORAL_TABLET | Freq: Every day | ORAL | Status: DC
  Administered 2015-02-16 – 2015-02-21 (×6): 1 mg via ORAL
  Filled 2015-02-16 (×6): qty 1

## 2015-02-16 MED ORDER — TRIAMCINOLONE ACETONIDE 0.5 % EX OINT
TOPICAL_OINTMENT | Freq: Three times a day (TID) | CUTANEOUS | Status: DC
  Administered 2015-02-16: 22:00:00 via TOPICAL
  Administered 2015-02-16: 1 via TOPICAL
  Administered 2015-02-17: 11:00:00 via TOPICAL
  Filled 2015-02-16: qty 15

## 2015-02-16 MED ORDER — METHOTREXATE 2.5 MG PO TABS
2.5000 mg | ORAL_TABLET | Freq: Once | ORAL | Status: AC
  Administered 2015-02-16: 2.5 mg via ORAL
  Filled 2015-02-16: qty 1

## 2015-02-16 NOTE — Progress Notes (Signed)
Pharmacy Antibiotic Time-Out Note  Charles [name] is a 64 y.o. year-old male admitted on 02/08/2015.  The patient is currently on Vanc and Rocephin for suspected joint infection.  Assessment/Plan:  64 yo male presenting with fever, pain in his right knee. Pt reports he has been seen by a dermatologist and told he has eczema/psoriasis. Pt reports rash and pain have been getting significantly worse.  Vanc trough below target on 750mg  q12h so will increase to 1250mg  q12h and recheck trough in AM on 11/30.  Lost IV access. RN to let pharmacy know when the next dose is able to be administered so we can time subsequent doses.     Temp (24hrs), Avg:97.9 F (36.6 C), Min:97.5 F (36.4 C), Max:98.1 F (36.7 C)   Recent Labs Lab 02/10/15 0312 02/12/15 0530 02/13/15 0256 02/14/15 0624 02/16/15 0500  WBC 22.1* 23.0* 20.3* 19.6* 25.1*     Recent Labs Lab 02/12/15 0530 02/13/15 0256 02/14/15 0624 02/15/15 0434 02/16/15 0500  CREATININE 1.28* 1.57* 1.57* 1.48* 1.62*   Estimated Creatinine Clearance: 41.9 mL/min (by C-G formula based on Cr of 1.62).   Antimicrobial allergies: None  Antimicrobials this admission: Zosyn 11/21 >> 11/23 Vanc 11/21 >>  Rocephin 11/23 >> 11/27  Levels/dose changes this admission: 11/23: increase vanc 750mg  q12h for improved SCr and higher trough goal 11/26: Vanc trough = 11mg /l on 750mg  q12h 11/30 VT : _____  Microbiology Results: 11/21 blood: 1 of 2 Group C strep, MRSA  11/21 blood: 1 of 2 Diphtheroids FINAL  11/21 urine: >100k MRSA FINAL  11/21 MRSA PCR: positive  11/21 R knee: abundant WBC, no organisms seen  11/22 blood: ngtd  11/22 stool: abundant staph aureus  11/23 bcx x2 (repeat): ngtd  Thank you for allowing pharmacy to be a part of this patient's care.  Charles [name], PharmD, BCPS Pager [phone] 02/16/2015 4:12 PM

## 2015-02-16 NOTE — Progress Notes (Signed)
Subjective: Still with intense pruritis   Antibiotics:  Anti-infectives    Start     Dose/Rate Route Frequency Ordered Stop   02/13/15 2000  vancomycin (VANCOCIN) 1,250 mg in sodium chloride 0.9 % 250 mL IVPB     1,250 mg 166.7 mL/hr over 90 Minutes Intravenous Every 12 hours 02/13/15 1725     02/12/15 1530  cefTRIAXone (ROCEPHIN) 2 g in dextrose 5 % 50 mL IVPB  Status:  Discontinued     2 g 100 mL/hr over 30 Minutes Intravenous Daily 02/12/15 1514 02/14/15 1402   02/10/15 1230  cefTRIAXone (ROCEPHIN) 2 g in dextrose 5 % 50 mL IVPB  Status:  Discontinued     2 g 100 mL/hr over 30 Minutes Intravenous Every 24 hours 02/10/15 1158 02/12/15 1514   02/10/15 1200  vancomycin (VANCOCIN) IVPB 750 mg/150 ml premix  Status:  Discontinued     750 mg 150 mL/hr over 60 Minutes Intravenous Every 12 hours 02/10/15 1000 02/13/15 1725   02/09/15 1600  vancomycin (VANCOCIN) 1,250 mg in sodium chloride 0.9 % 250 mL IVPB  Status:  Discontinued     1,250 mg 166.7 mL/hr over 90 Minutes Intravenous Every 24 hours 02/08/15 1725 02/09/15 0743   02/09/15 1600  vancomycin (VANCOCIN) IVPB 1000 mg/200 mL premix  Status:  Discontinued     1,000 mg 200 mL/hr over 60 Minutes Intravenous Every 24 hours 02/09/15 0743 02/10/15 1000   02/09/15 0000  piperacillin-tazobactam (ZOSYN) IVPB 3.375 g  Status:  Discontinued     3.375 g 12.5 mL/hr over 240 Minutes Intravenous Every 8 hours 02/08/15 1725 02/10/15 1157   02/08/15 1600  vancomycin (VANCOCIN) IVPB 1000 mg/200 mL premix     1,000 mg 200 mL/hr over 60 Minutes Intravenous  Once 02/08/15 1548 02/08/15 1802   02/08/15 1600  piperacillin-tazobactam (ZOSYN) IVPB 3.375 g     3.375 g 100 mL/hr over 30 Minutes Intravenous  Once 02/08/15 1548 02/08/15 1701      Medications: Scheduled Meds: . feeding supplement (ENSURE ENLIVE)  237 mL Oral BID BM  . hydrocerin   Topical BID  . hydrOXYzine  25 mg Oral TID  . methotrexate  2.5 mg Oral Once  . sodium chloride  3  mL Intravenous Q12H  . triamcinolone ointment   Topical TID  . vancomycin  1,250 mg Intravenous Q12H   Continuous Infusions:  PRN Meds:.acetaminophen **OR** acetaminophen, diphenhydrAMINE, HYDROcodone-acetaminophen, LORazepam, morphine injection, ondansetron **OR** ondansetron (ZOFRAN) IV, sodium chloride    Objective: Weight change:   Intake/Output Summary (Last 24 hours) at 02/16/15 1705 Last data filed at 02/16/15 1345  Gross per 24 hour  Intake    490 ml  Output      0 ml  Net    490 ml   Blood pressure 123/82, pulse 72, temperature 98.1 F (36.7 C), temperature source Axillary, resp. rate 20, height 5\' 7"  (1.702 m), weight 171 lb 11.8 oz (77.9 kg), SpO2 91 %. Temp:  [97.5 F (36.4 C)-98.1 F (36.7 C)] 98.1 F (36.7 C) (11/29 1328) Pulse Rate:  [72-120] 72 (11/29 1328) Resp:  [20] 20 (11/29 1328) BP: (92-123)/(51-82) 123/82 mmHg (11/29 1328) SpO2:  [91 %-100 %] 91 % (11/29 1328)  Physical Exam: General: Alert and awake, scaling over scalp,  HEENT: anicteric sclera, pupils reactive to light and accommodation, EOMI CVS regular rate, normal r,  no murmur rubs or gallops Chest: clear to auscultation bilaterally, no wheezing, rales or rhonchi Abdomen: soft nontender, nondistended, normal bowel  sounds, Skin:diffuse eczema scaling, right arm wrapped Right knee with effusion: tender Neuro: nonfocal  CBC: CBC Latest Ref Rng 02/16/2015 02/14/2015 02/13/2015  WBC 4.0 - 10.5 K/uL 25.1(H) 19.6(H) 20.3(H)  Hemoglobin 13.0 - 17.0 g/dL 2.1(H9.2(L) 10.2(L) 9.4(L)  Hematocrit 39.0 - 52.0 % 29.8(L) 33.3(L) 30.0(L)  Platelets 150 - 400 K/uL 343 367 418(H)       BMET  Recent Labs  02/15/15 0434 02/16/15 0500  NA 141 144  K 4.6 5.0  CL 113* 113*  CO2 21* 24  GLUCOSE 87 95  BUN 23* 24*  CREATININE 1.48* 1.62*  CALCIUM 7.7* 7.7*     Liver Panel  No results for input(s): PROT, ALBUMIN, AST, ALT, ALKPHOS, BILITOT, BILIDIR, IBILI in the last 72 hours.     Sedimentation  Rate  Recent Labs  02/14/15 1235  ESRSEDRATE 18*   C-Reactive Protein  Recent Labs  02/14/15 1235  CRP 5.4*    Micro Results: Recent Results (from the past 720 hour(s))  Blood Culture (routine x 2)     Status: None   Collection Time: 02/08/15  2:23 PM  Result Value Ref Range Status   Specimen Description BLOOD RIGHT ARM  Final   Special Requests IN PEDIATRIC BOTTLE 3CC  Final   Culture  Setup Time   Final    GRAM POSITIVE RODS AEROBIC BOTTLE ONLY CRITICAL RESULT CALLED TO, READ BACK BY AND VERIFIED WITH: [facility] RN 2046 02/09/15 A BROWNING    Culture   Final    DIPHTHEROIDS(CORYNEBACTERIUM SPECIES) Standardized susceptibility testing for this organism is not available. Performed at [facility]    Report Status 02/10/2015 FINAL  Final  Blood Culture (routine x 2)     Status: None   Collection Time: 02/08/15  2:31 PM  Result Value Ref Range Status   Specimen Description RIGHT ANTECUBITAL  Final   Special Requests IN PEDIATRIC BOTTLE 3CC  Final   Culture  Setup Time   Final    GRAM POSITIVE COCCI IN CLUSTERS GRAM POSITIVE COCCI IN CHAINS AEROBIC BOTTLE ONLY CRITICAL RESULT CALLED TO, READ BACK BY AND VERIFIED WITH: SCOTT @0715  02/09/15 MKELLY     Culture   Final    STREPTOCOCCUS GROUP C METHICILLIN RESISTANT STAPHYLOCOCCUS AUREUS Performed at [facility]    Report Status 02/12/2015 FINAL  Final   Organism ID, Bacteria STREPTOCOCCUS GROUP C  Final   Organism ID, Bacteria METHICILLIN RESISTANT STAPHYLOCOCCUS AUREUS  Final      Susceptibility   Methicillin resistant staphylococcus aureus - MIC*    CIPROFLOXACIN >=8 RESISTANT Resistant     ERYTHROMYCIN >=8 RESISTANT Resistant     GENTAMICIN <=0.5 SENSITIVE Sensitive     OXACILLIN >=4 RESISTANT Resistant     TETRACYCLINE <=1 SENSITIVE Sensitive     VANCOMYCIN <=0.5 SENSITIVE Sensitive     TRIMETH/SULFA <=10 SENSITIVE Sensitive     CLINDAMYCIN <=0.25 SENSITIVE Sensitive     RIFAMPIN <=0.5 SENSITIVE  Sensitive     Inducible Clindamycin NEGATIVE Sensitive     * METHICILLIN RESISTANT STAPHYLOCOCCUS AUREUS   Streptococcus group c - MIC*    CLINDAMYCIN >=1 RESISTANT Resistant     AMPICILLIN <=0.25 SENSITIVE Sensitive     ERYTHROMYCIN >=8 RESISTANT Resistant     VANCOMYCIN 0.5 SENSITIVE Sensitive     CEFTRIAXONE <=0.12 SENSITIVE Sensitive     LEVOFLOXACIN 0.5 SENSITIVE Sensitive     * STREPTOCOCCUS GROUP C  Urine culture     Status: None   Collection Time:  02/08/15  5:52 PM  Result Value Ref Range Status   Specimen Description URINE, CLEAN CATCH  Final   Special Requests NONE  Final   Culture   Final    >=100,000 COLONIES/mL METHICILLIN RESISTANT STAPHYLOCOCCUS AUREUS Performed at [facility]    Report Status 02/10/2015 FINAL  Final   Organism ID, Bacteria METHICILLIN RESISTANT STAPHYLOCOCCUS AUREUS  Final      Susceptibility   Methicillin resistant staphylococcus aureus - MIC*    CIPROFLOXACIN >=8 RESISTANT Resistant     GENTAMICIN <=0.5 SENSITIVE Sensitive     NITROFURANTOIN <=16 SENSITIVE Sensitive     OXACILLIN >=4 RESISTANT Resistant     TETRACYCLINE <=1 SENSITIVE Sensitive     VANCOMYCIN 1 SENSITIVE Sensitive     TRIMETH/SULFA <=10 SENSITIVE Sensitive     CLINDAMYCIN <=0.25 SENSITIVE Sensitive     RIFAMPIN <=0.5 SENSITIVE Sensitive     Inducible Clindamycin NEGATIVE Sensitive     * >=100,000 COLONIES/mL METHICILLIN RESISTANT STAPHYLOCOCCUS AUREUS  Body fluid culture     Status: None   Collection Time: 02/08/15  7:42 PM  Result Value Ref Range Status   Specimen Description SYNOVIAL R KNEE  Final   Special Requests Normal  Final   Gram Stain   Final    ABUNDANT WBC PRESENT, PREDOMINANTLY PMN NO ORGANISMS SEEN Gram Stain Report Called to,Read Back By and Verified With: Ulyses Amor [id number] @ 2126 BY J SCOTTON    Culture   Final    NO GROWTH 3 DAYS Performed at [facility]    Report Status 02/12/2015 FINAL  Final  MRSA PCR Screening     Status:  Abnormal   Collection Time: 02/08/15 10:41 PM  Result Value Ref Range Status   MRSA by PCR POSITIVE (A) NEGATIVE Final    Comment:        The GeneXpert MRSA Assay (FDA approved for NASAL specimens only), is one component of a comprehensive MRSA colonization surveillance program. It is not intended to diagnose MRSA infection nor to guide or monitor treatment for MRSA infections. RESULT CALLED TO, READ BACK BY AND VERIFIED WITH: CROFTS,S RN [phone] COVINGTON,N   Culture, blood (routine x 2)     Status: None   Collection Time: 02/09/15  2:30 PM  Result Value Ref Range Status   Specimen Description BLOOD RIGHT ANTECUBITAL  Final   Special Requests BOTTLES DRAWN AEROBIC AND ANAEROBIC 10CC  Final   Culture   Final    NO GROWTH 5 DAYS Performed at [facility]    Report Status 02/14/2015 FINAL  Final  Stool culture     Status: None   Collection Time: 02/09/15  5:36 PM  Result Value Ref Range Status   Specimen Description STOOL PERIRECTAL  Final   Special Requests Immunocompromised  Final   Culture   Final    ABUNDANT STAPHYLOCOCCUS AUREUS Note: RIFAMPIN AND GENTAMICIN SHOULD NOT BE USED AS SINGLE DRUGS FOR TREATMENT OF STAPH INFECTIONS. NO SALMONELLA, SHIGELLA, CAMPYLOBACTER, YERSINIA, OR E.COLI 0157:[id number] ISOLATED Note: REDUCED NORMAL FLORA PRESENT Performed at [facility]    Report Status 02/13/2015 FINAL  Final  Culture, blood (routine x 2)     Status: None   Collection Time: 02/10/15  3:45 PM  Result Value Ref Range Status   Specimen Description BLOOD RIGHT ARM  Final   Special Requests BOTTLES DRAWN AEROBIC AND ANAEROBIC 10 CC EA  Final   Culture   Final    NO GROWTH 5 DAYS  Performed at [facility]    Report Status 02/15/2015 FINAL  Final  Culture, blood (routine x 2)     Status: None   Collection Time: 02/10/15  5:24 PM  Result Value Ref Range Status   Specimen Description BLOOD RIGHT ANTECUBITAL  Final   Special Requests BOTTLES DRAWN  AEROBIC AND ANAEROBIC 10CC  Final   Culture   Final    NO GROWTH 5 DAYS Performed at [facility]    Report Status 02/15/2015 FINAL  Final  C difficile quick scan w PCR reflex     Status: None   Collection Time: 02/16/15  2:59 AM  Result Value Ref Range Status   C Diff antigen NEGATIVE NEGATIVE Final   C Diff toxin NEGATIVE NEGATIVE Final   C Diff interpretation Negative for toxigenic C. difficile  Final    Studies/Results: No results found.    Assessment/Plan:  INTERVAL HISTORY:  Patient refused surgery over Thanksgiving Holidays 02/16/15: He again refused surgery today but then after discussion with [facility] and again with me and his wife present he agreed to surgery of his knee   Active Problems:   Acute renal failure (ARF) (HCC)   Anemia   Cellulitis   Sepsis (HCC)   UTI (lower urinary tract infection)   Severe sepsis (HCC)   Staphylococcus aureus bacteremia with sepsis (HCC)   Staphylococcal arthritis of right knee (HCC)   Erythroderma   Screen for STD (sexually transmitted disease)   Poor intravenous access   Streptococcal infection group G   Cellulitis of knee    Charles [name] is a 64 y.o. 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 Specific aspects of clinical management are critical to optimizing the outcome of patients with SAB. Therefore, the [facility] Antimicrobial Management Team [facility]) has initiated an intervention aimed at improving the management of SAB at [facility]. To do so, Infectious Diseases physicians are providing an evidence-based consult for the management of all patients with  SAB.     Yes No Comments  Perform follow-up blood cultures (even if the patient is afebrile) to ensure clearance of bacteremia [X]  [ ]  BLOOD CULTURES TAKEN last Wednesday No Growth  Remove vascular catheter and obtain follow-up blood cultures after the removal of the catheter  [ ]  PICC placed after blood cultures were taken  Perform echocardiography to evaluate for endocarditis (transthoracic ECHO is 40-50% sensitive, TEE is > 90% sensitive) [ ]  [ ]  Please keep in mind, that neither test can definitively EXCLUDE endocarditis, and that should clinical suspicion remain high for endocarditis the patient should then still be treated with an "endocarditis" duration of therapy = 6 weeks TTE without vegetations, would get TEE when able  Consult electrophysiologist to evaluate implanted cardiac device (pacemaker, ICD) [ ]  [ ]  NA  Ensure source control [ ]  [ ]  Have all abscesses been drained effectively? Have deep seeded infections (septic joints or osteomyelitis) had appropriate surgical debridement?  The KNEE IS INFECTED AND NEEDS FORMAL SURGERY , And Dr. Victorino Dike is willing to take the patient to the OR.  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 IF HE DOES NOT THIS WILL BE A "TICKING TIME BOMB" THAT COULD PUT HIM BACK IN THE ICU   Investigate for "metastatic" sites of infection   Does the patient have ANY symptom or physical exam finding that would suggest a deeper infection (back or neck pain that may be suggestive of vertebral osteomyelitis or epidural abscess, muscle pain that could be a symptom of pyomyositis)?  Keep in mind that for deep seeded infections MRI imaging with contrast is preferred rather than other often insensitive tests such as plain x-rays, especially early in a  patient's presentation.  SEE PREVIOUS DISCUSSION  Change antibiotic therapy to VANCOMYCIN (rocephin DC'd by Dr. Drue Second I believe to reduce risk of CDI from broad spectrum abx since the strep would also be covered by his vancomycin which is reasonable)   Beta-lactam antibiotics are preferred for MSSA due to higher cure rates.  If on Vancomycin, goal trough should be 15 - 20 mcg/mL  Estimated duration of IV antibiotic therapy: 6 WEEKS   Consult case management for probably prolonged outpatient IV antibiotic therapy   #2 Septic knee : see above and would favor open arthrotomy  #3 MRSA in urine:  seeded from blood not other way around  #4  Diffuse rash: may have erythroderma superimposed onto the chronic eczema: Given severity of his skin involvement I would also consider transfer to [facility] or other academic center for Dermatology expertise. [facility] with their Plastics, Burn and Dermatology expertise would be another option   I spent greater than 40 minutes with the patient including greater than 50% of time in face to face counsel of the patient and his wife re nature of MRSA bacteremia, morbidity and mortality and need for source control and in coordination of his care.           LOS: 8 days   Acey Lav 02/16/2015, 5:05 PM

## 2015-02-16 NOTE — Progress Notes (Signed)
Agree with previous RN's assessment. 

## 2015-02-16 NOTE — Treatment Plan (Signed)
Patient ultimately consented to surgery this afternoon after discussion with wife. I subsequently discussed the case with Orthopedic Surgery (Dr. Ranell PatrickNorris). Per Dr. Ranell PatrickNorris, there is concern for complications should pt undergo surgery, especially as pt is continually scratching all over. Especially concern for wound dehiscence and super-infection from skin flora given diffuse rash should patient undergo surgery. Instead, Dr. Ranell PatrickNorris recommends continued monitoring at this time given lack of growth from fluid culture. Dr. Ranell PatrickNorris to continue to follow.

## 2015-02-16 NOTE — Progress Notes (Signed)
[facility] PROGRESS NOTE  Charles [name] ZOX:[id number] DOB: 30-Jan-1951 DOA: 02/08/2015 PCP: Geraldo Pitter, MD  HPI/Brief narrative 63 y.o. male with h/o eczema presented with 24 hours of right knee pain. He was found to be in sepsis from MRSA UTI and right knee infection. He was started on vancomycin and zosyn. Orthopedics consulted.   Assessment/Plan: Sepsis secondary to urinary tract infection with hematuria/possible bacteremia -Upon admission, patient was febrile with tachycardia and tachypnea with leukocytosis -Lactic acid was elevated on admission and has since normalized.. -UA: Positive nitrites, large leukocytes, TNTC WBC, many bacteria -Urine culture shows MRSA sensitive to vancomyin.  -Blood culture: GPC and rods, One bottle grew MRAS and group c streptococcus. Repeat cultures ordered and have been negative so far.  -Echocardiogram showed Good LVEF , no vegetations seen.  -Continue IVF, vancomycin, rocephin - Auto consult from ID appreciated. Recs for 6 weeks of vancomycin for presumed septic arthritis -Patient had declined orthopedic surgery thus far, but today, with wife present, patient has now agreed to proceed with surgery  Acute kidney injury  -unknown baseline - stable at 1.62.   - ID recommends twice weekly vanc trough  Right knee pain -Given the patient was septic, orthopedics was consulted and appreciated -Patient status post right knee aspiration which showed 66,000 WBC -No growth to date - patient will need knee washout, orthopedics consulted and but patient had earlier refused surgery. With wife now present, patient now agrees to surgery. Have discussed with Orthopedic Surgery  - consulted PT and OT, who recommended snf. Marland Kitchen  ?Psoriasis  -Patient needs to follow up dermatology as an outpatient -patient has scaly skin, with multiple excoriations , scratching , benadryl works for short period, atarax added with some relief.  - Pt's primary  dermatologist was contacted. Per dermatology, patient has a history of noncompliance of treatment x 3 years and was supposed to be on methotrexate with topical triamcinolone. Discussed with ID and it is OK to give methotrexate while here. Will give one dose and monitor. Will order triamcinolone  Sinus tachycardia -Likely secondary to sepsis, improved.  -TSH 2.273,.  Transient hypotension -resolved with IVF -Likely secondary to sepsis.  Normocytic anemia  -Unknown baseline hemoglobin, stable hemoglobin.  -Continue to monitor CBC. -Anemia panel shows iron of 12, ferritin of 80. - stool for occult blood is negative.   Poor IV Access:  PICC line put in, but it came out , requested IR to put in a tunnelled PICC line that would stay in place and he would need it for atleast 4 weeks of antibiotics.   Leukocytosis: slowly improving.  Tachycardia; from the sepsis, sinus tachy and improving.   Code Status: Full Family Communication: Pt in room Disposition Plan: Pending SNF placement in 24-48hrs   Consultants:  ID  IR  Orthopedic Surgery  Procedures:  RIGHT knee aspiration 11/22  Antibiotics: Anti-infectives    Start     Dose/Rate Route Frequency Ordered Stop   02/13/15 2000  vancomycin (VANCOCIN) 1,250 mg in sodium chloride 0.9 % 250 mL IVPB     1,250 mg 166.7 mL/hr over 90 Minutes Intravenous Every 12 hours 02/13/15 1725     02/12/15 1530  cefTRIAXone (ROCEPHIN) 2 g in dextrose 5 % 50 mL IVPB  Status:  Discontinued     2 g 100 mL/hr over 30 Minutes Intravenous Daily 02/12/15 1514 02/14/15 1402   02/10/15 1230  cefTRIAXone (ROCEPHIN) 2 g in dextrose 5 % 50 mL IVPB  Status:  Discontinued  2 g 100 mL/hr over 30 Minutes Intravenous Every 24 hours 02/10/15 1158 02/12/15 1514   02/10/15 1200  vancomycin (VANCOCIN) IVPB 750 mg/150 ml premix  Status:  Discontinued     750 mg 150 mL/hr over 60 Minutes Intravenous Every 12 hours 02/10/15 1000 02/13/15 1725   02/09/15 1600   vancomycin (VANCOCIN) 1,250 mg in sodium chloride 0.9 % 250 mL IVPB  Status:  Discontinued     1,250 mg 166.7 mL/hr over 90 Minutes Intravenous Every 24 hours 02/08/15 1725 02/09/15 0743   02/09/15 1600  vancomycin (VANCOCIN) IVPB 1000 mg/200 mL premix  Status:  Discontinued     1,000 mg 200 mL/hr over 60 Minutes Intravenous Every 24 hours 02/09/15 0743 02/10/15 1000   02/09/15 0000  piperacillin-tazobactam (ZOSYN) IVPB 3.375 g  Status:  Discontinued     3.375 g 12.5 mL/hr over 240 Minutes Intravenous Every 8 hours 02/08/15 1725 02/10/15 1157   02/08/15 1600  vancomycin (VANCOCIN) IVPB 1000 mg/200 mL premix     1,000 mg 200 mL/hr over 60 Minutes Intravenous  Once 02/08/15 1548 02/08/15 1802   02/08/15 1600  piperacillin-tazobactam (ZOSYN) IVPB 3.375 g     3.375 g 100 mL/hr over 30 Minutes Intravenous  Once 02/08/15 1548 02/08/15 1701      HPI/Subjective: Complains of generalized itching  Objective: Filed Vitals:   02/15/15 0509 02/15/15 2112 02/16/15 0551 02/16/15 1328  BP: 90/75 102/51 92/75 123/82  Pulse: 115 120 110 72  Temp: 97.2 F (36.2 C) 97.5 F (36.4 C) 98 F (36.7 C) 98.1 F (36.7 C)  TempSrc: Oral Oral Axillary Axillary  Resp: 20 20 20 20   Height:      Weight:      SpO2:  100% 97% 91%    Intake/Output Summary (Last 24 hours) at 02/16/15 1427 Last data filed at 02/16/15 0900  Gross per 24 hour  Intake    250 ml  Output      0 ml  Net    250 ml   Filed Weights   02/08/15 1619 02/08/15 2252  Weight: 70.308 kg (155 lb) 77.9 kg (171 lb 11.8 oz)    Exam:   General:  Awake, laying in bed, in nad  Cardiovascular: regular, s1, s2  Respiratory: normal resp effort, no wheezing  Abdomen: soft,nondistended  Musculoskeletal: perfused, no clubbing, no cyanosis  Data Reviewed: Basic Metabolic Panel:  Recent Labs Lab 02/12/15 0530 02/13/15 0256 02/14/15 0624 02/15/15 0434 02/16/15 0500  NA 143 143 145 141 144  K 4.6 4.8 5.0 4.6 5.0  CL 117* 116*  116* 113* 113*  CO2 18* 21* 21* 21* 24  GLUCOSE 94 82 87 87 95  BUN 20 22* 20 23* 24*  CREATININE 1.28* 1.57* 1.57* 1.48* 1.62*  CALCIUM 7.4* 7.5* 7.9* 7.7* 7.7*   Liver Function Tests: No results for input(s): AST, ALT, ALKPHOS, BILITOT, PROT, ALBUMIN in the last 168 hours. No results for input(s): LIPASE, AMYLASE in the last 168 hours. No results for input(s): AMMONIA in the last 168 hours. CBC:  Recent Labs Lab 02/10/15 0312 02/12/15 0530 02/13/15 0256 02/14/15 0624 02/16/15 0500  WBC 22.1* 23.0* 20.3* 19.6* 25.1*  HGB 9.5* 10.1* 9.4* 10.2* 9.2*  HCT 29.4* 32.0* 30.0* 33.3* 29.8*  MCV 87.8 87.7 87.7 89.0 87.4  PLT 420* 443* 418* 367 343   Cardiac Enzymes: No results for input(s): CKTOTAL, CKMB, CKMBINDEX, TROPONINI in the last 168 hours. BNP (last 3 results) No results for input(s): BNP in the  last 8760 hours.  ProBNP (last 3 results) No results for input(s): PROBNP in the last 8760 hours.  CBG:  Recent Labs Lab 02/12/15 0835 02/12/15 0913 02/12/15 1006  GLUCAP 35* 63* 93    Recent Results (from the past 240 hour(s))  Blood Culture (routine x 2)     Status: None   Collection Time: 02/08/15  2:23 PM  Result Value Ref Range Status   Specimen Description BLOOD RIGHT ARM  Final   Special Requests IN PEDIATRIC BOTTLE 3CC  Final   Culture  Setup Time   Final    GRAM POSITIVE RODS AEROBIC BOTTLE ONLY CRITICAL RESULT CALLED TO, READ BACK BY AND VERIFIED WITH: A [facility] RN 2046 02/09/15 A BROWNING    Culture   Final    DIPHTHEROIDS(CORYNEBACTERIUM SPECIES) Standardized susceptibility testing for this organism is not available. Performed at [facility]    Report Status 02/10/2015 FINAL  Final  Blood Culture (routine x 2)     Status: None   Collection Time: 02/08/15  2:31 PM  Result Value Ref Range Status   Specimen Description RIGHT ANTECUBITAL  Final   Special Requests IN PEDIATRIC BOTTLE 3CC  Final   Culture  Setup Time   Final    GRAM POSITIVE  COCCI IN CLUSTERS GRAM POSITIVE COCCI IN CHAINS AEROBIC BOTTLE ONLY CRITICAL RESULT CALLED TO, READ BACK BY AND VERIFIED WITH: SCOTT  02/09/15 MKELLY     Culture   Final    STREPTOCOCCUS GROUP C METHICILLIN RESISTANT STAPHYLOCOCCUS AUREUS Performed at [facility]    Report Status 02/12/2015 FINAL  Final   Organism ID, Bacteria STREPTOCOCCUS GROUP C  Final   Organism ID, Bacteria METHICILLIN RESISTANT STAPHYLOCOCCUS AUREUS  Final      Susceptibility   Methicillin resistant staphylococcus aureus - MIC*    CIPROFLOXACIN >=8 RESISTANT Resistant     ERYTHROMYCIN >=8 RESISTANT Resistant     GENTAMICIN <=0.5 SENSITIVE Sensitive     OXACILLIN >=4 RESISTANT Resistant     TETRACYCLINE <=1 SENSITIVE Sensitive     VANCOMYCIN <=0.5 SENSITIVE Sensitive     TRIMETH/SULFA <=10 SENSITIVE Sensitive     CLINDAMYCIN <=0.25 SENSITIVE Sensitive     RIFAMPIN <=0.5 SENSITIVE Sensitive     Inducible Clindamycin NEGATIVE Sensitive     * METHICILLIN RESISTANT STAPHYLOCOCCUS AUREUS   Streptococcus group c - MIC*    CLINDAMYCIN >=1 RESISTANT Resistant     AMPICILLIN <=0.25 SENSITIVE Sensitive     ERYTHROMYCIN >=8 RESISTANT Resistant     VANCOMYCIN 0.5 SENSITIVE Sensitive     CEFTRIAXONE <=0.12 SENSITIVE Sensitive     LEVOFLOXACIN 0.5 SENSITIVE Sensitive     * STREPTOCOCCUS GROUP C  Urine culture     Status: None   Collection Time: 02/08/15  5:52 PM  Result Value Ref Range Status   Specimen Description URINE, CLEAN CATCH  Final   Special Requests NONE  Final   Culture   Final    >=100,000 COLONIES/mL METHICILLIN RESISTANT STAPHYLOCOCCUS AUREUS Performed at [facility]    Report Status 02/10/2015 FINAL  Final   Organism ID, Bacteria METHICILLIN RESISTANT STAPHYLOCOCCUS AUREUS  Final      Susceptibility   Methicillin resistant staphylococcus aureus - MIC*    CIPROFLOXACIN >=8 RESISTANT Resistant     GENTAMICIN <=0.5 SENSITIVE Sensitive     NITROFURANTOIN <=16 SENSITIVE Sensitive      OXACILLIN >=4 RESISTANT Resistant     TETRACYCLINE <=1 SENSITIVE Sensitive     VANCOMYCIN 1  SENSITIVE Sensitive     TRIMETH/SULFA <=10 SENSITIVE Sensitive     CLINDAMYCIN <=0.25 SENSITIVE Sensitive     RIFAMPIN <=0.5 SENSITIVE Sensitive     Inducible Clindamycin NEGATIVE Sensitive     * >=100,000 COLONIES/mL METHICILLIN RESISTANT STAPHYLOCOCCUS AUREUS  Body fluid culture     Status: None   Collection Time: 02/08/15  7:42 PM  Result Value Ref Range Status   Specimen Description SYNOVIAL R KNEE  Final   Special Requests Normal  Final   Gram Stain   Final    ABUNDANT WBC PRESENT, PREDOMINANTLY PMN NO ORGANISMS SEEN Gram Stain Report Called to,Read Back By and Verified With: Ulyses Amor [id number] @ 2126 BY J SCOTTON    Culture   Final    NO GROWTH 3 DAYS Performed at [facility]    Report Status 02/12/2015 FINAL  Final  MRSA PCR Screening     Status: Abnormal   Collection Time: 02/08/15 10:41 PM  Result Value Ref Range Status   MRSA by PCR POSITIVE (A) NEGATIVE Final    Comment:        The GeneXpert MRSA Assay (FDA approved for NASAL specimens only), is one component of a comprehensive MRSA colonization surveillance program. It is not intended to diagnose MRSA infection nor to guide or monitor treatment for MRSA infections. RESULT CALLED TO, READ BACK BY AND VERIFIED WITH: CROFTS,S RN [phone] COVINGTON,N   Culture, blood (routine x 2)     Status: None   Collection Time: 02/09/15  2:30 PM  Result Value Ref Range Status   Specimen Description BLOOD RIGHT ANTECUBITAL  Final   Special Requests BOTTLES DRAWN AEROBIC AND ANAEROBIC 10CC  Final   Culture   Final    NO GROWTH 5 DAYS Performed at [facility]    Report Status 02/14/2015 FINAL  Final  Stool culture     Status: None   Collection Time: 02/09/15  5:36 PM  Result Value Ref Range Status   Specimen Description STOOL PERIRECTAL  Final   Special Requests Immunocompromised  Final   Culture    Final    ABUNDANT STAPHYLOCOCCUS AUREUS Note: RIFAMPIN AND GENTAMICIN SHOULD NOT BE USED AS SINGLE DRUGS FOR TREATMENT OF STAPH INFECTIONS. NO SALMONELLA, SHIGELLA, CAMPYLOBACTER, YERSINIA, OR E.COLI 0157:H7 ISOLATED Note: REDUCED NORMAL FLORA PRESENT Performed at [facility]    Report Status 02/13/2015 FINAL  Final  Culture, blood (routine x 2)     Status: None   Collection Time: 02/10/15  3:45 PM  Result Value Ref Range Status   Specimen Description BLOOD RIGHT ARM  Final   Special Requests BOTTLES DRAWN AEROBIC AND ANAEROBIC 10 CC EA  Final   Culture   Final    NO GROWTH 5 DAYS Performed at [facility]    Report Status 02/15/2015 FINAL  Final  Culture, blood (routine x 2)     Status: None   Collection Time: 02/10/15  5:24 PM  Result Value Ref Range Status   Specimen Description BLOOD RIGHT ANTECUBITAL  Final   Special Requests BOTTLES DRAWN AEROBIC AND ANAEROBIC 10CC  Final   Culture   Final    NO GROWTH 5 DAYS Performed at [facility]    Report Status 02/15/2015 FINAL  Final  C difficile quick scan w PCR reflex     Status: None   Collection Time: 02/16/15  2:59 AM  Result Value Ref Range Status   C Diff antigen NEGATIVE NEGATIVE Final  C Diff toxin NEGATIVE NEGATIVE Final   C Diff interpretation Negative for toxigenic C. difficile  Final     Studies: No results found.  Scheduled Meds: . feeding supplement (ENSURE ENLIVE)  237 mL Oral BID BM  . hydrocerin   Topical BID  . hydrOXYzine  25 mg Oral TID  . sodium chloride  3 mL Intravenous Q12H  . vancomycin  1,250 mg Intravenous Q12H   Continuous Infusions:   Active Problems:   Acute renal failure (ARF) (HCC)   Anemia   Cellulitis   Sepsis (HCC)   UTI (lower urinary tract infection)   Severe sepsis (HCC)   Staphylococcus aureus bacteremia with sepsis (HCC)   Staphylococcal arthritis of right knee (HCC)   Erythroderma   Screen for STD (sexually transmitted disease)   Poor intravenous  access   Streptococcal infection group G   Cellulitis of knee   CHIU, STEPHEN K  [facility] Pager [phone]. If 7PM-7AM, please contact night-coverage at [email or web address], password [email or web address] 02/16/2015, 2:27 PM  LOS: 8 days

## 2015-02-16 NOTE — Progress Notes (Signed)
Orthopedics Progress Note  Subjective: Patient reports less pain in his knee.  Objective:  Filed Vitals:   02/15/15 2112 02/16/15 0551  BP: 102/51 92/75  Pulse: 120 110  Temp: 97.5 F (36.4 C) 98 F (36.7 C)  Resp: 20 20    General: Awake and alert  Musculoskeletal: right knee much less tender. No pain with AROM of the knee today.  Also no pain with PROM. Effusion is almost gone. No warmth.  Generalized edema is noted in both LEs. Neurovascularly intact  Lab Results  Component Value Date   WBC 25.1* 02/16/2015   HGB 9.2* 02/16/2015   HCT 29.8* 02/16/2015   MCV 87.4 02/16/2015   PLT 343 02/16/2015       Component Value Date/Time   NA 144 02/16/2015 0500   K 5.0 02/16/2015 0500   CL 113* 02/16/2015 0500   CO2 24 02/16/2015 0500   GLUCOSE 95 02/16/2015 0500   BUN 24* 02/16/2015 0500   CREATININE 1.62* 02/16/2015 0500   CALCIUM 7.7* 02/16/2015 0500   GFRNONAA 43* 02/16/2015 0500   GFRAA 49* 02/16/2015 0500    Lab Results  Component Value Date   INR 1.55* 02/09/2015   INR 1.63* 02/09/2015    Assessment/Plan: Right knee pain resolving.  At this point the patient refuses any surgical intervention.  The knee aspirate was no growth on cultures and negative foe bacteria on gram stain.  This may have been an inflammatory effusion when he presented. Continue supportive care.  Let us know if things change for Charles [name], otherwise we will sign off now. Thanks!   Almedia BallsSteven R. Ranell PatrickNorris, MD 02/16/2015 7:27 AM

## 2015-02-16 NOTE — Progress Notes (Signed)
Witnessed pt scratching, appears to be digging, "clawing", into his skin; have given pt scheduled hydroxyzine and prn Benadryl with what appears to be minimal relief; pt removed PICC line drsg again from scratching and restlessness; redressed site Marvia PicklesJames, Jenniferlynn Saad Sara, RN

## 2015-02-16 NOTE — Progress Notes (Signed)
TCF Infection RN, confirmed c-diff negative, remove enteric precautions, keep contact precautions in place; order entered, room/door sign updated Marvia PicklesJames, Lynnix Schoneman Sara, RN

## 2015-02-16 NOTE — Progress Notes (Signed)
PICC line dressing loosely hanging off again; skin has generalized weeping, appearing like deep wide scratches with layers of skin removed throughout legs, arms, face, neck, chest, abdomen; pt is very restless, rolling towards side of bed; bed alarm on; attempted to reorient pt; IV Team at bedside to redress PICC Marvia PicklesJames, Lashica Hannay Sara, RN

## 2015-02-17 DIAGNOSIS — A4102 Sepsis due to Methicillin resistant Staphylococcus aureus: Secondary | ICD-10-CM

## 2015-02-17 DIAGNOSIS — L539 Erythematous condition, unspecified: Secondary | ICD-10-CM

## 2015-02-17 DIAGNOSIS — A4101 Sepsis due to Methicillin susceptible Staphylococcus aureus: Principal | ICD-10-CM

## 2015-02-17 DIAGNOSIS — M00061 Staphylococcal arthritis, right knee: Secondary | ICD-10-CM

## 2015-02-17 DIAGNOSIS — N172 Acute kidney failure with medullary necrosis: Secondary | ICD-10-CM

## 2015-02-17 DIAGNOSIS — D6489 Other specified anemias: Secondary | ICD-10-CM

## 2015-02-17 DIAGNOSIS — N390 Urinary tract infection, site not specified: Secondary | ICD-10-CM

## 2015-02-17 LAB — CBC
HEMATOCRIT: 28.6 % — AB (ref 39.0–52.0)
HEMOGLOBIN: 8.9 g/dL — AB (ref 13.0–17.0)
MCH: 27.1 pg (ref 26.0–34.0)
MCHC: 31.1 g/dL (ref 30.0–36.0)
MCV: 87.2 fL (ref 78.0–100.0)
Platelets: 323 10*3/uL (ref 150–400)
RBC: 3.28 MIL/uL — AB (ref 4.22–5.81)
RDW: 19.1 % — ABNORMAL HIGH (ref 11.5–15.5)
WBC: 21.5 10*3/uL — ABNORMAL HIGH (ref 4.0–10.5)

## 2015-02-17 LAB — VANCOMYCIN, RANDOM: VANCOMYCIN RM: 32 ug/mL

## 2015-02-17 LAB — BASIC METABOLIC PANEL
ANION GAP: 6 (ref 5–15)
BUN: 24 mg/dL — ABNORMAL HIGH (ref 6–20)
CALCIUM: 7.8 mg/dL — AB (ref 8.9–10.3)
CO2: 23 mmol/L (ref 22–32)
Chloride: 119 mmol/L — ABNORMAL HIGH (ref 101–111)
Creatinine, Ser: 1.62 mg/dL — ABNORMAL HIGH (ref 0.61–1.24)
GFR calc non Af Amer: 43 mL/min — ABNORMAL LOW (ref >60)
GFR, EST AFRICAN AMERICAN: 49 mL/min — AB (ref >60)
GLUCOSE: 107 mg/dL — AB (ref 65–99)
POTASSIUM: 5.4 mmol/L — AB (ref 3.5–5.1)
Sodium: 148 mmol/L — ABNORMAL HIGH (ref 135–145)

## 2015-02-17 LAB — VANCOMYCIN, TROUGH: VANCOMYCIN TR: 35 ug/mL — AB (ref 10.0–20.0)

## 2015-02-17 MED ORDER — SODIUM CHLORIDE 0.45 % IV SOLN
INTRAVENOUS | Status: DC
  Administered 2015-02-17 – 2015-02-21 (×5): via INTRAVENOUS

## 2015-02-17 MED ORDER — TRIAMCINOLONE ACETONIDE 0.1 % EX OINT
TOPICAL_OINTMENT | Freq: Two times a day (BID) | CUTANEOUS | Status: DC
Start: 2015-02-17 — End: 2015-02-22
  Administered 2015-02-17 – 2015-02-20 (×7): via TOPICAL
  Filled 2015-02-17 (×5): qty 15

## 2015-02-17 MED ORDER — DAPTOMYCIN 500 MG IV SOLR
500.0000 mg | INTRAVENOUS | Status: DC
  Administered 2015-02-18 – 2015-02-21 (×3): 500 mg via INTRAVENOUS
  Filled 2015-02-17 (×4): qty 10

## 2015-02-17 NOTE — Progress Notes (Signed)
Critical Vanc level 35 per lab.  Primary RN Abby and Pharmacy Jinga notified.  Per Pharmacy, lab drawn while antibiotic running.  Pharmacy to follow up with RN Abby.

## 2015-02-17 NOTE — Progress Notes (Signed)
Physical Therapy Treatment Patient Details Name: Charles [name] MRN: [id number] DOB: 02/28/1951 Today's Date: 02/17/2015    History of Present Illness 64 y/o male admitted with severe eczymya and sepsis, + for UTI. R knee effusion aspirated two days ago before abx started.    PT Comments    Pt has open, weeping wounds over entire body, RN reports he's had cognitive decliner in past few days. Pt was lethargic this morning but was able to participate in bed to recliner transfer. He responds with grunts and groans when asked questions but was able to follow simple commands.   Follow Up Recommendations  SNF     Equipment Recommendations  None recommended by PT    Recommendations for Other Services       Precautions / Restrictions Precautions Precautions: Fall Restrictions Weight Bearing Restrictions: No    Mobility  Bed Mobility Overal bed mobility: Needs Assistance Bed Mobility: Supine to Sit     Supine to sit: Mod assist     General bed mobility comments: increased time, mod A to initiate movement and to pivot hips to EOB with pad, pt able to elevate trunk pushing up with bedrail  Transfers Overall transfer level: Needs assistance   Transfers: Stand Pivot Transfers;Sit to/from Stand Sit to Stand: Min assist;+2 safety/equipment Stand pivot transfers: Min assist;+2 safety/equipment       General transfer comment: min A to rise and for safety, min A to pivot, increased time, verbal cues for hand placement, no signs of pain with WB RLE, single UE support on armrest of recliner  Ambulation/Gait             General Gait Details: unable d/t pain   Stairs            Wheelchair Mobility    Modified Rankin (Stroke Patients Only)       Balance     Sitting balance-Leahy Scale: Fair       Standing balance-Leahy Scale: Poor                      Cognition Arousal/Alertness: Lethargic Behavior During Therapy: Flat affect Overall  Cognitive Status: Difficult to assess (minimal verbalization, pt grunts and moans in response to questions)                      Exercises      General Comments        Pertinent Vitals/Pain Pain Assessment: No/denies pain    Home Living                      Prior Function            PT Goals (current goals can now be found in the care plan section) Acute Rehab PT Goals Patient Stated Goal: to be able to walk, less pain PT Goal Formulation: With patient Time For Goal Achievement: 02/25/15 Potential to Achieve Goals: Fair Additional Goals Additional Goal #1: Pt will propel w/c  x 50'  with supervision (if unable to amb d/t pain) Progress towards PT goals: Progressing toward goals    Frequency  Min 3X/week    PT Plan Current plan remains appropriate    Co-evaluation             End of Session   Activity Tolerance: Patient limited by fatigue;Patient limited by lethargy Patient left: in chair;with call bell/phone within reach;with chair alarm set;with family/visitor present;with nursing/sitter in room  Time: 4098-1191 PT Time Calculation (min) (ACUTE ONLY): 16 min  Charges:  $Therapeutic Activity: 8-22 mins                    G Codes:      Ralene Bathe Kistler 02/17/2015, 10:12 AM [phone]

## 2015-02-17 NOTE — Progress Notes (Signed)
ANTIBIOTIC CONSULT NOTE - INITIAL  Pharmacy Consult for Daptomycin Indication: Septic shock due to MRSAB and Streptococcal infection/Septic knee, MRSA UTI  No Known Allergies  Patient Measurements: Height:  (170.2 cm) Weight: 171 lb 11.8 oz (77.9 kg) IBW/kg (Calculated) : 66.1   Vital Signs: Temp: 97.3 F (36.3 C) (11/30 2314) Temp Source: Oral (11/30 2314) BP: 97/54 mmHg (11/30 2314) Pulse Rate: 116 (11/30 2314) Intake/Output from previous day: 11/29 0701 - 11/30 0700 In: 520 [P.O.:270; IV Piggyback:250] Out: -  Intake/Output from this shift:    Labs:  Recent Labs  02/15/15 0434 02/16/15 0500 02/17/15 0440  WBC  --  25.1* 21.5*  HGB  --  9.2* 8.9*  PLT  --  343 323  CREATININE 1.48* 1.62* 1.62*   Estimated Creatinine Clearance: 41.9 mL/min (by C-G formula based on Cr of 1.62).  Recent Labs  02/17/15 0810 02/17/15 2235  VANCOTROUGH 35*  --   VANCORANDOM  --  32     Microbiology: Recent Results (from the past 720 hour(s))  Blood Culture (routine x 2)     Status: None   Collection Time: 02/08/15  2:23 PM  Result Value Ref Range Status   Specimen Description BLOOD RIGHT ARM  Final   Special Requests IN PEDIATRIC BOTTLE 3CC  Final   Culture  Setup Time   Final    GRAM POSITIVE RODS AEROBIC BOTTLE ONLY CRITICAL RESULT CALLED TO, READ BACK BY AND VERIFIED WITH: A [facility] RN 2046 02/09/15 A BROWNING    Culture   Final    DIPHTHEROIDS(CORYNEBACTERIUM SPECIES) Standardized susceptibility testing for this organism is not available. Performed at [facility]    Report Status 02/10/2015 FINAL  Final  Blood Culture (routine x 2)     Status: None   Collection Time: 02/08/15  2:31 PM  Result Value Ref Range Status   Specimen Description RIGHT ANTECUBITAL  Final   Special Requests IN PEDIATRIC BOTTLE 3CC  Final   Culture  Setup Time   Final    GRAM POSITIVE COCCI IN CLUSTERS GRAM POSITIVE COCCI IN CHAINS AEROBIC BOTTLE ONLY CRITICAL RESULT CALLED  TO, READ BACK BY AND VERIFIED WITH: SCOTT  02/09/15 MKELLY     Culture   Final    STREPTOCOCCUS GROUP C METHICILLIN RESISTANT STAPHYLOCOCCUS AUREUS Performed at [facility]    Report Status 02/12/2015 FINAL  Final   Organism ID, Bacteria STREPTOCOCCUS GROUP C  Final   Organism ID, Bacteria METHICILLIN RESISTANT STAPHYLOCOCCUS AUREUS  Final      Susceptibility   Methicillin resistant staphylococcus aureus - MIC*    CIPROFLOXACIN >=8 RESISTANT Resistant     ERYTHROMYCIN >=8 RESISTANT Resistant     GENTAMICIN <=0.5 SENSITIVE Sensitive     OXACILLIN >=4 RESISTANT Resistant     TETRACYCLINE <=1 SENSITIVE Sensitive     VANCOMYCIN <=0.5 SENSITIVE Sensitive     TRIMETH/SULFA <=10 SENSITIVE Sensitive     CLINDAMYCIN <=0.25 SENSITIVE Sensitive     RIFAMPIN <=0.5 SENSITIVE Sensitive     Inducible Clindamycin NEGATIVE Sensitive     * METHICILLIN RESISTANT STAPHYLOCOCCUS AUREUS   Streptococcus group c - MIC*    CLINDAMYCIN >=1 RESISTANT Resistant     AMPICILLIN <=0.25 SENSITIVE Sensitive     ERYTHROMYCIN >=8 RESISTANT Resistant     VANCOMYCIN 0.5 SENSITIVE Sensitive     CEFTRIAXONE <=0.12 SENSITIVE Sensitive     LEVOFLOXACIN 0.5 SENSITIVE Sensitive     * STREPTOCOCCUS GROUP C  Urine culture  Status: None   Collection Time: 02/08/15  5:52 PM  Result Value Ref Range Status   Specimen Description URINE, CLEAN CATCH  Final   Special Requests NONE  Final   Culture   Final    >=100,000 COLONIES/mL METHICILLIN RESISTANT STAPHYLOCOCCUS AUREUS Performed at [facility]    Report Status 02/10/2015 FINAL  Final   Organism ID, Bacteria METHICILLIN RESISTANT STAPHYLOCOCCUS AUREUS  Final      Susceptibility   Methicillin resistant staphylococcus aureus - MIC*    CIPROFLOXACIN >=8 RESISTANT Resistant     GENTAMICIN <=0.5 SENSITIVE Sensitive     NITROFURANTOIN <=16 SENSITIVE Sensitive     OXACILLIN >=4 RESISTANT Resistant     TETRACYCLINE <=1 SENSITIVE Sensitive      VANCOMYCIN 1 SENSITIVE Sensitive     TRIMETH/SULFA <=10 SENSITIVE Sensitive     CLINDAMYCIN <=0.25 SENSITIVE Sensitive     RIFAMPIN <=0.5 SENSITIVE Sensitive     Inducible Clindamycin NEGATIVE Sensitive     * >=100,000 COLONIES/mL METHICILLIN RESISTANT STAPHYLOCOCCUS AUREUS  Body fluid culture     Status: None   Collection Time: 02/08/15  7:42 PM  Result Value Ref Range Status   Specimen Description SYNOVIAL R KNEE  Final   Special Requests Normal  Final   Gram Stain   Final    ABUNDANT WBC PRESENT, PREDOMINANTLY PMN NO ORGANISMS SEEN Gram Stain Report Called to,Read Back By and Verified With: Ulyses AmorERRI DOSTER,RN [phone] @ 2126 BY J SCOTTON    Culture   Final    NO GROWTH 3 DAYS Performed at [facility]    Report Status 02/12/2015 FINAL  Final  MRSA PCR Screening     Status: Abnormal   Collection Time: 02/08/15 10:41 PM  Result Value Ref Range Status   MRSA by PCR POSITIVE (A) NEGATIVE Final    Comment:        The GeneXpert MRSA Assay (FDA approved for NASAL specimens only), is one component of a comprehensive MRSA colonization surveillance program. It is not intended to diagnose MRSA infection nor to guide or monitor treatment for MRSA infections. RESULT CALLED TO, READ BACK BY AND VERIFIED WITH: CROFTS,S RN [phone] [id number] COVINGTON,N   Culture, blood (routine x 2)     Status: None   Collection Time: 02/09/15  2:30 PM  Result Value Ref Range Status   Specimen Description BLOOD RIGHT ANTECUBITAL  Final   Special Requests BOTTLES DRAWN AEROBIC AND ANAEROBIC 10CC  Final   Culture   Final    NO GROWTH 5 DAYS Performed at [facility]    Report Status 02/14/2015 FINAL  Final  Stool culture     Status: None   Collection Time: 02/09/15  5:36 PM  Result Value Ref Range Status   Specimen Description STOOL PERIRECTAL  Final   Special Requests Immunocompromised  Final   Culture   Final    ABUNDANT STAPHYLOCOCCUS AUREUS Note: RIFAMPIN AND GENTAMICIN SHOULD NOT BE  USED AS SINGLE DRUGS FOR TREATMENT OF STAPH INFECTIONS. NO SALMONELLA, SHIGELLA, CAMPYLOBACTER, YERSINIA, OR E.COLI 0157:H7 ISOLATED Note: REDUCED NORMAL FLORA PRESENT Performed at [facility]    Report Status 02/13/2015 FINAL  Final  Culture, blood (routine x 2)     Status: None   Collection Time: 02/10/15  3:45 PM  Result Value Ref Range Status   Specimen Description BLOOD RIGHT ARM  Final   Special Requests BOTTLES DRAWN AEROBIC AND ANAEROBIC 10 CC EA  Final   Culture   Final  NO GROWTH 5 DAYS Performed at [facility]    Report Status 02/15/2015 FINAL  Final  Culture, blood (routine x 2)     Status: None   Collection Time: 02/10/15  5:24 PM  Result Value Ref Range Status   Specimen Description BLOOD RIGHT ANTECUBITAL  Final   Special Requests BOTTLES DRAWN AEROBIC AND ANAEROBIC 10CC  Final   Culture   Final    NO GROWTH 5 DAYS Performed at [facility]    Report Status 02/15/2015 FINAL  Final  C difficile quick scan w PCR reflex     Status: None   Collection Time: 02/16/15  2:59 AM  Result Value Ref Range Status   C Diff antigen NEGATIVE NEGATIVE Final   C Diff toxin NEGATIVE NEGATIVE Final   C Diff interpretation Negative for toxigenic C. difficile  Final    Medical History: Past Medical History  Diagnosis Date  . Eczema     Medications:  Prescriptions prior to admission  Medication Sig Dispense Refill Last Dose  . hydrOXYzine (ATARAX/VISTARIL) 25 MG tablet Take 1 tablet (25 mg total) by mouth every 8 (eight) hours as needed. (Patient not taking: Reported on 02/08/2015) 12 tablet 0 Not Taking at Unknown time  . predniSONE (DELTASONE) 20 MG tablet Take 3 tablets (60 mg total) by mouth daily. (Patient not taking: Reported on 02/08/2015) 9 tablet 0 Completed Course at Unknown time  . triamcinolone cream (KENALOG) 0.1 % Apply 1 application topically 2 (two) times daily. (Patient not taking: Reported on 02/08/2015) 45 g 0 Not Taking at Unknown  time   Scheduled:  . feeding supplement (ENSURE ENLIVE)  237 mL Oral BID BM  . folic acid  1 mg Oral Daily  . hydrocerin   Topical BID  . hydrOXYzine  25 mg Oral TID  . sodium chloride  3 mL Intravenous Q12H  . triamcinolone ointment   Topical BID   Infusions:  . sodium chloride 100 mL/hr at 02/17/15 0850   Assessment: 52 yoM admitted with septic shock due to Staphylococcus aureus and Streptococcus with septic knee, urine seeded from blood.  ID changing pt from Vancomycin to Cubicin per Rx.   2235 Random Vanc=32, per MD will defer starting Cubicin until am to let Vanc level drop. Antimicrobials this admission: 11/21 >> Zosyn >> 11/23  11/21 >> Vanc >> 11/30 11/23 >> Rocephin >> 11/27 11/30 >> Cubicin >>  Microbiology Results: 11/21 blood: 1 of 2 Group C strep, MRSA 11/21 blood: 1 of 2 Diphtheroids FINAL 11/21 urine: >100k MRSA FINAL 11/21 MRSA PCR: positive 11/21 R knee: abundant WBC, no organisms seen 11/22 blood x 2: ngf 11/22 stool: abundant staph aureus 11/23 blood x2 (repeat): ngf 11/29: c diff negative  Goal of Therapy:  Treat infection  Plan:   Cubicin  IV q24h  F/u cultures/Scr as needed  Susanne Greenhouse R 02/17/2015,11:34 PM

## 2015-02-17 NOTE — Progress Notes (Addendum)
Progress Note   Santonio Speakman SHF:[id number] DOB: 04/02/1950 DOA: Feb 20, 2015 PCP: Elyn Peers, MD   Brief Narrative:   Nicanor Mendolia is an 64 y.o. male with h/o eczema presented with 24 hours of right knee pain. He was found to be in sepsis from MRSA UTI and right knee infection. He was started on vancomycin and zosyn. Orthopedics & ID subsequently consulted.   Culture Data: BCUX x 2 Feb 20, 2015: Diptheroids in one bottle, MRSA and group C streptococcus in other bottle. Urine culture 2015-02-20: MRSA Right knee synovial fluid culture: Negative BCUX x 2 02/09/15: Negative    Assessment/Plan:   Principal problem:  Sepsis secondary to urinary tract infection with hematuria/MRSA and group C streptococcus bacteremia - Met criteria for sepsis on admission, source: Urinary tract, urine culture + MRSA sensitive to vancomyin.  - Blood culture 2: GPC and rods,One bottle grew MRSA and group c streptococcus.  - Repeat cultures ordered and have been negative so far.  - Echocardiogram showed Good LVEF , no vegetations seen.  - Continue IVF, vancomycin, rocephin. - Auto consult from ID appreciated. Recs for 6 weeks of vancomycin for presumed septic arthritis. - Orthopedic surgeon continues to follow with no current recommendations for surgery given high risk of complications.  Active problems:  Hypoglycemia - Noted on admission.  Hypernatremia/hyperkalemia - Start hypotonic IV fluids and 100 mL/h.  Acute kidney injury versus stage III chronic kidney disease - Likely has stage III chronic kidney disease given relative stability of creatinine in the 1.5-1.7 rangesince admission. - Baseline creatinine not documented. - ID recommends twice weekly vanc trough.  Right knee pain - Patient status post right knee aspiration which showed 66,000 WBC, synovial fluid cultures negative to date. - Orthopedic surgeon following, no current recommendations for surgical intervention given high  risk. - Status post PT/OT consultations, SNF recommended. - Open arthrotomy recommended by ID.  Continue Vancomycin and Rocephin to target MRSA and group C streptococcus.  ?Psoriasis  - Patient needs to follow up dermatology as an outpatient - Pt's primary dermatologist was contacted.  - Per dermatology, he has a history of noncompliance of treatment x 3 years and was supposed to be on methotrexate with topical triamcinolone.  - Discussed with ID and it is OK to give methotrexate while here. Given one dose and started on triamcinolone. - Given severity of skin eruption, would transfer to [facility] for urgent dermatology consultation.  Sinus tachycardia - Likely secondary to sepsis, improved.  - TSH 2.273.  Transient hypotension - Resolved with IVF, thought to be from sepsis.  Normocytic anemia  - Unknown baseline hemoglobin, stable hemoglobin.  - Continue to monitor CBC. - Anemia panel shows iron of 12, ferritin of 80. - Stool for occult blood is negative.   Poor IV Access - PICC line put in, but it came out. - Tunnelled PICC line placed.   Leukocytosis: slowly improving - Secondary to infection/inflammation.    DVT Prophylaxis - SCDs ordered.   Family Communication/Anticipated D/C date and plan/Code Status   Family Communication: No family currently at the bedside. Disposition Plan: Home when stable. Anticipated D/C date:   Critically ill, unable to determine, with ongoing need for IV antibiotics.   Code Status: Full code.  IV Access:    PICC line placed 02/14/15  Procedures and diagnostic studies:   Dg Chest Port 1 View  02-20-2015  CLINICAL DATA:  64 year old male with fever EXAM: PORTABLE CHEST 1 VIEW COMPARISON:  Right knee radiographs obtained concurrently FINDINGS:  Heart is upper limits of normal for size. Mediastinal contours are within normal limits. Atherosclerotic calcifications are present in the transverse aorta. The lungs are clear. No pleural effusion  or pneumothorax. No acute osseous abnormality. IMPRESSION: 1. Borderline cardiomegaly. 2. Aortic atherosclerosis. 3. No acute cardiopulmonary process. Electronically Signed   By: Jacqulynn Cadet M.D.   On: 02/08/2015 17:50   Dg Knee Right Port  02/08/2015  CLINICAL DATA:  63 year old male with 1 day history of right knee pain and low-grade fever EXAM: PORTABLE RIGHT KNEE - 1-2 VIEW COMPARISON:  Prior radiographs of the lower leg including the ankle 09/27/2010 FINDINGS: Positive for small ankle joint effusion. No evidence of acute fracture or malalignment. Normal bony mineralization. No lytic or blastic osseous lesion. No periosteal reaction. Mild tricompartmental degenerative change. IMPRESSION: 1. Positive for small suprapatellar knee joint effusion. Septic arthritis should be considered within the differential given the clinical history of fever. Additionally, degenerative and inflammatory effusions are considerations. 2. Mild tricompartmental osteoarthritis. Electronically Signed   By: Jacqulynn Cadet M.D.   On: 02/08/2015 17:52     Medical Consultants:    ID: Truman Hayward, MD  Orthopedic Surgery: Netta Cedars, MD  Anti-Infectives:    Vancomycin 02/08/15--->  Rocephin 02/10/15--->  Zosyn 02/08/15---> 02/10/15   Subjective:   Sande Rives is somnolent and groans with discomfort when awakened.    Objective:    Filed Vitals:   02/16/15 2202 02/17/15 0425 02/17/15 0613 02/17/15 0644  BP: 107/58 119/79    Pulse: 110 118 120 117  Temp: 97.5 F (36.4 C) 98 F (36.7 C)    TempSrc: Oral Axillary    Resp: 18 18    Height:      Weight:      SpO2: 99% 95% 90% 95%    Intake/Output Summary (Last 24 hours) at 02/17/15 0805 Last data filed at 02/17/15 0644  Gross per 24 hour  Intake    520 ml  Output      0 ml  Net    520 ml   Filed Weights   02/08/15 1619 02/08/15 2252  Weight: 70.308 kg (155 lb) 77.9 kg (171 lb 11.8 oz)    Exam: Gen:   Lethargic Cardiovascular:  Tachy, No M/R/G Respiratory:  Lungs CTAB Gastrointestinal:  Abdomen soft, NT/ND, + BS Extremities:  No C/E/C Skin: As documented below:         Data Reviewed:    Labs: Basic Metabolic Panel:  Recent Labs Lab 02/13/15 0256 02/14/15 0624 02/15/15 0434 02/16/15 0500 02/17/15 0440  NA 143 145 141 144 148*  K 4.8 5.0 4.6 5.0 5.4*  CL 116* 116* 113* 113* 119*  CO2 21* 21* 21* 24 23  GLUCOSE 82 87 87 95 107*  BUN 22* 20 23* 24* 24*  CREATININE 1.57* 1.57* 1.48* 1.62* 1.62*  CALCIUM 7.5* 7.9* 7.7* 7.7* 7.8*   GFR Estimated Creatinine Clearance: 41.9 mL/min (by C-G formula based on Cr of 1.62). Liver Function Tests: No results for input(s): AST, ALT, ALKPHOS, BILITOT, PROT, ALBUMIN in the last 168 hours. No results for input(s): LIPASE, AMYLASE in the last 168 hours. No results for input(s): AMMONIA in the last 168 hours. Coagulation profile No results for input(s): INR, PROTIME in the last 168 hours.  CBC:  Recent Labs Lab 02/12/15 0530 02/13/15 0256 02/14/15 0624 02/16/15 0500 02/17/15 0440  WBC 23.0* 20.3* 19.6* 25.1* 21.5*  HGB 10.1* 9.4* 10.2* 9.2* 8.9*  HCT 32.0* 30.0* 33.3* 29.8* 28.6*  MCV  87.7 87.7 89.0 87.4 87.2  PLT 443* 418* 367 343 323   CBG:  Recent Labs Lab 02/12/15 0835 02/12/15 0913 02/12/15 1006  GLUCAP 35* 63* 93   Sepsis Labs:  Recent Labs Lab 02/12/15 0530 02/13/15 0256 02/14/15 0624 02/16/15 0500 02/17/15 0440  WBC 23.0* 20.3* 19.6* 25.1* 21.5*  LATICACIDVEN 2.0  --   --   --   --    Microbiology Recent Results (from the past 240 hour(s))  Blood Culture (routine x 2)     Status: None   Collection Time: 02/08/15  2:23 PM  Result Value Ref Range Status   Specimen Description BLOOD RIGHT ARM  Final   Special Requests IN PEDIATRIC BOTTLE 3CC  Final   Culture  Setup Time   Final    GRAM POSITIVE RODS AEROBIC BOTTLE ONLY CRITICAL RESULT CALLED TO, READ BACK BY AND VERIFIED WITH: A [facility] RN  2046 02/09/15 A BROWNING    Culture   Final    DIPHTHEROIDS(CORYNEBACTERIUM SPECIES) Standardized susceptibility testing for this organism is not available. Performed at [facility]    Report Status 02/10/2015 FINAL  Final  Blood Culture (routine x 2)     Status: None   Collection Time: 02/08/15  2:31 PM  Result Value Ref Range Status   Specimen Description RIGHT ANTECUBITAL  Final   Special Requests IN PEDIATRIC BOTTLE 3CC  Final   Culture  Setup Time   Final    GRAM POSITIVE COCCI IN CLUSTERS GRAM POSITIVE COCCI IN CHAINS AEROBIC BOTTLE ONLY CRITICAL RESULT CALLED TO, READ BACK BY AND VERIFIED WITH: SCOTT _0  02/09/15 MKELLY     Culture   Final    STREPTOCOCCUS GROUP C METHICILLIN RESISTANT STAPHYLOCOCCUS AUREUS Performed at [facility]    Report Status 02/12/2015 FINAL  Final   Organism ID, Bacteria STREPTOCOCCUS GROUP C  Final   Organism ID, Bacteria METHICILLIN RESISTANT STAPHYLOCOCCUS AUREUS  Final      Susceptibility   Methicillin resistant staphylococcus aureus - MIC*    CIPROFLOXACIN >=8 RESISTANT Resistant     ERYTHROMYCIN >=8 RESISTANT Resistant     GENTAMICIN <=0.5 SENSITIVE Sensitive     OXACILLIN >=4 RESISTANT Resistant     TETRACYCLINE <=1 SENSITIVE Sensitive     VANCOMYCIN <=0.5 SENSITIVE Sensitive     TRIMETH/SULFA <=10 SENSITIVE Sensitive     CLINDAMYCIN <=0.25 SENSITIVE Sensitive     RIFAMPIN <=0.5 SENSITIVE Sensitive     Inducible Clindamycin NEGATIVE Sensitive     * METHICILLIN RESISTANT STAPHYLOCOCCUS AUREUS   Streptococcus group c - MIC*    CLINDAMYCIN >=1 RESISTANT Resistant     AMPICILLIN <=0.25 SENSITIVE Sensitive     ERYTHROMYCIN >=8 RESISTANT Resistant     VANCOMYCIN 0.5 SENSITIVE Sensitive     CEFTRIAXONE <=0.12 SENSITIVE Sensitive     LEVOFLOXACIN 0.5 SENSITIVE Sensitive     * STREPTOCOCCUS GROUP C  Urine culture     Status: None   Collection Time: 02/08/15  5:52 PM  Result Value Ref Range Status   Specimen Description  URINE, CLEAN CATCH  Final   Special Requests NONE  Final   Culture   Final    >=100,000 COLONIES/mL METHICILLIN RESISTANT STAPHYLOCOCCUS AUREUS Performed at [facility]    Report Status 02/10/2015 FINAL  Final   Organism ID, Bacteria METHICILLIN RESISTANT STAPHYLOCOCCUS AUREUS  Final      Susceptibility   Methicillin resistant staphylococcus aureus - MIC*    CIPROFLOXACIN >=8 RESISTANT Resistant  GENTAMICIN <=0.5 SENSITIVE Sensitive     NITROFURANTOIN <=16 SENSITIVE Sensitive     OXACILLIN >=4 RESISTANT Resistant     TETRACYCLINE <=1 SENSITIVE Sensitive     VANCOMYCIN 1 SENSITIVE Sensitive     TRIMETH/SULFA <=10 SENSITIVE Sensitive     CLINDAMYCIN <=0.25 SENSITIVE Sensitive     RIFAMPIN <=0.5 SENSITIVE Sensitive     Inducible Clindamycin NEGATIVE Sensitive     * >=100,000 COLONIES/mL METHICILLIN RESISTANT STAPHYLOCOCCUS AUREUS  Body fluid culture     Status: None   Collection Time: 02/08/15  7:42 PM  Result Value Ref Range Status   Specimen Description SYNOVIAL R KNEE  Final   Special Requests Normal  Final   Gram Stain   Final    ABUNDANT WBC PRESENT, PREDOMINANTLY PMN NO ORGANISMS SEEN Gram Stain Report Called to,Read Back By and Verified With: Mort Sawyers [id number] @ 2126 BY J SCOTTON    Culture   Final    NO GROWTH 3 DAYS Performed at [facility]    Report Status 02/12/2015 FINAL  Final  MRSA PCR Screening     Status: Abnormal   Collection Time: 02/08/15 10:41 PM  Result Value Ref Range Status   MRSA by PCR POSITIVE (A) NEGATIVE Final    Comment:        The GeneXpert MRSA Assay (FDA approved for NASAL specimens only), is one component of a comprehensive MRSA colonization surveillance program. It is not intended to diagnose MRSA infection nor to guide or monitor treatment for MRSA infections. RESULT CALLED TO, READ BACK BY AND VERIFIED WITH: CROFTS,S RN [phone] COVINGTON,N   Culture, blood (routine x 2)     Status: None   Collection  Time: 02/09/15  2:30 PM  Result Value Ref Range Status   Specimen Description BLOOD RIGHT ANTECUBITAL  Final   Special Requests BOTTLES DRAWN AEROBIC AND ANAEROBIC 10CC  Final   Culture   Final    NO GROWTH 5 DAYS Performed at [facility]    Report Status 02/14/2015 FINAL  Final  Stool culture     Status: None   Collection Time: 02/09/15  5:36 PM  Result Value Ref Range Status   Specimen Description STOOL PERIRECTAL  Final   Special Requests Immunocompromised  Final   Culture   Final    ABUNDANT STAPHYLOCOCCUS AUREUS Note: RIFAMPIN AND GENTAMICIN SHOULD NOT BE USED AS SINGLE DRUGS FOR TREATMENT OF STAPH INFECTIONS. NO SALMONELLA, SHIGELLA, CAMPYLOBACTER, YERSINIA, OR E.COLI 0157:H7 ISOLATED Note: REDUCED NORMAL FLORA PRESENT Performed at [facility]    Report Status 02/13/2015 FINAL  Final  Culture, blood (routine x 2)     Status: None   Collection Time: 02/10/15  3:45 PM  Result Value Ref Range Status   Specimen Description BLOOD RIGHT ARM  Final   Special Requests BOTTLES DRAWN AEROBIC AND ANAEROBIC 10 CC EA  Final   Culture   Final    NO GROWTH 5 DAYS Performed at [facility]    Report Status 02/15/2015 FINAL  Final  Culture, blood (routine x 2)     Status: None   Collection Time: 02/10/15  5:24 PM  Result Value Ref Range Status   Specimen Description BLOOD RIGHT ANTECUBITAL  Final   Special Requests BOTTLES DRAWN AEROBIC AND ANAEROBIC 10CC  Final   Culture   Final    NO GROWTH 5 DAYS Performed at [facility]    Report Status 02/15/2015 FINAL  Final  C difficile  quick scan w PCR reflex     Status: None   Collection Time: 02/16/15  2:59 AM  Result Value Ref Range Status   C Diff antigen NEGATIVE NEGATIVE Final   C Diff toxin NEGATIVE NEGATIVE Final   C Diff interpretation Negative for toxigenic C. difficile  Final     Medications:   . feeding supplement (ENSURE ENLIVE)  237 mL Oral BID BM  . folic acid  1 mg Oral Daily  .  hydrocerin   Topical BID  . hydrOXYzine  25 mg Oral TID  . sodium chloride  3 mL Intravenous Q12H  . triamcinolone ointment   Topical TID  . vancomycin  1,250 mg Intravenous Q12H   Continuous Infusions:   Time spent: 1 hour.   LOS: 9 days   [facility] Pager [phone]. If unable to reach me by pager, please call my cell phone at [phone].  *Please refer to [email or web address], password [email or web address] to get updated schedule on who will round on this patient, as hospitalists switch teams weekly. If 7PM-7AM, please contact night-coverage at [email or web address], password [email or web address] for any overnight needs.  02/17/2015, 8:05 AM

## 2015-02-17 NOTE — Progress Notes (Addendum)
Subjective: Patient fairly somnolent this afternoon  Antibiotics:  Anti-infectives    Start     Dose/Rate Route Frequency Ordered Stop   02/13/15 2000  vancomycin (VANCOCIN) 1,250 mg in sodium chloride 0.9 % 250 mL IVPB  Status:  Discontinued     1,250 mg 166.7 mL/hr over 90 Minutes Intravenous Every 12 hours 02/13/15 1725 02/17/15 0906   02/12/15 1530  cefTRIAXone (ROCEPHIN) 2 g in dextrose 5 % 50 mL IVPB  Status:  Discontinued     2 g 100 mL/hr over 30 Minutes Intravenous Daily 02/12/15 1514 02/14/15 1402   02/10/15 1230  cefTRIAXone (ROCEPHIN) 2 g in dextrose 5 % 50 mL IVPB  Status:  Discontinued     2 g 100 mL/hr over 30 Minutes Intravenous Every 24 hours 02/10/15 1158 02/12/15 1514   02/10/15 1200  vancomycin (VANCOCIN) IVPB 750 mg/150 ml premix  Status:  Discontinued     750 mg 150 mL/hr over 60 Minutes Intravenous Every 12 hours 02/10/15 1000 02/13/15 1725   02/09/15 1600  vancomycin (VANCOCIN) 1,250 mg in sodium chloride 0.9 % 250 mL IVPB  Status:  Discontinued     1,250 mg 166.7 mL/hr over 90 Minutes Intravenous Every 24 hours 02/08/15 1725 02/09/15 0743   02/09/15 1600  vancomycin (VANCOCIN) IVPB 1000 mg/200 mL premix  Status:  Discontinued     1,000 mg 200 mL/hr over 60 Minutes Intravenous Every 24 hours 02/09/15 0743 02/10/15 1000   02/09/15 0000  piperacillin-tazobactam (ZOSYN) IVPB 3.375 g  Status:  Discontinued     3.375 g 12.5 mL/hr over 240 Minutes Intravenous Every 8 hours 02/08/15 1725 02/10/15 1157   02/08/15 1600  vancomycin (VANCOCIN) IVPB 1000 mg/200 mL premix     1,000 mg 200 mL/hr over 60 Minutes Intravenous  Once 02/08/15 1548 02/08/15 1802   02/08/15 1600  piperacillin-tazobactam (ZOSYN) IVPB 3.375 g     3.375 g 100 mL/hr over 30 Minutes Intravenous  Once 02/08/15 1548 02/08/15 1701      Medications: Scheduled Meds: . feeding supplement (ENSURE ENLIVE)  237 mL Oral BID BM  . folic acid  1 mg Oral Daily  . hydrocerin   Topical BID  .  hydrOXYzine  25 mg Oral TID  . sodium chloride  3 mL Intravenous Q12H  . triamcinolone ointment   Topical BID   Continuous Infusions: . sodium chloride 100 mL/hr at 02/17/15 0850   PRN Meds:.acetaminophen **OR** acetaminophen, diphenhydrAMINE, HYDROcodone-acetaminophen, LORazepam, morphine injection, ondansetron **OR** ondansetron (ZOFRAN) IV, sodium chloride    Objective: Weight change:   Intake/Output Summary (Last 24 hours) at 02/17/15 1800 Last data filed at 02/17/15 0821  Gross per 24 hour  Intake    520 ml  Output      0 ml  Net    520 ml   Blood pressure 97/69, pulse 108, temperature 97.8 F (36.6 C), temperature source Axillary, resp. rate 18, height 5\' 7"  (1.702 m), weight 171 lb 11.8 oz (77.9 kg), SpO2 97 %. Temp:  [97.5 F (36.4 C)-98 F (36.7 C)] 97.8 F (36.6 C) (11/30 1500) Pulse Rate:  [108-120] 108 (11/30 1500) Resp:  [18] 18 (11/30 1500) BP: (97-119)/(58-79) 97/69 mmHg (11/30 1500) SpO2:  [90 %-99 %] 97 % (11/30 1500)  Physical Exam: General: sleeping throughout exam HEENT: anicteric sclera, pupils reactive to light and accommodation, EOMI CVS regular rate, normal r,  no murmur rubs or gallops Chest: clear to auscultation bilaterally, no wheezing, rales or rhonchi Abdomen: soft nontender, nondistended,  normal bowel sounds, Skin:diffuse eczema scaling, right arm wrapped Right knee with effusion: skin see picture:      Neuro: nonfocal  CBC: CBC Latest Ref Rng 02/17/2015 02/16/2015 02/14/2015  WBC 4.0 - 10.5 K/uL 21.5(H) 25.1(H) 19.6(H)  Hemoglobin 13.0 - 17.0 g/dL 1.3(Y) 8.6(V) 10.2(L)  Hematocrit 39.0 - 52.0 % 28.6(L) 29.8(L) 33.3(L)  Platelets 150 - 400 K/uL 323 343 367       BMET  Recent Labs  02/16/15 0500 02/17/15 0440  NA 144 148*  K 5.0 5.4*  CL 113* 119*  CO2 24 23  GLUCOSE 95 107*  BUN 24* 24*  CREATININE 1.62* 1.62*  CALCIUM 7.7* 7.8*     Liver Panel  No results for input(s): PROT, ALBUMIN, AST, ALT, ALKPHOS,  BILITOT, BILIDIR, IBILI in the last 72 hours.     Sedimentation Rate No results for input(s): ESRSEDRATE in the last 72 hours. C-Reactive Protein No results for input(s): CRP in the last 72 hours.  Micro Results: Recent Results (from the past 720 hour(s))  Blood Culture (routine x 2)     Status: None   Collection Time: 02/08/15  2:23 PM  Result Value Ref Range Status   Specimen Description BLOOD RIGHT ARM  Final   Special Requests IN PEDIATRIC BOTTLE 3CC  Final   Culture  Setup Time   Final    GRAM POSITIVE RODS AEROBIC BOTTLE ONLY CRITICAL RESULT CALLED TO, READ BACK BY AND VERIFIED WITH: A [facility] RN [id number] 02/09/15 A BROWNING    Culture   Final    DIPHTHEROIDS(CORYNEBACTERIUM SPECIES) Standardized susceptibility testing for this organism is not available. Performed at [facility]    Report Status 02/10/2015 FINAL  Final  Blood Culture (routine x 2)     Status: None   Collection Time: 02/08/15  2:31 PM  Result Value Ref Range Status   Specimen Description RIGHT ANTECUBITAL  Final   Special Requests IN PEDIATRIC BOTTLE 3CC  Final   Culture  Setup Time   Final    GRAM POSITIVE COCCI IN CLUSTERS GRAM POSITIVE COCCI IN CHAINS AEROBIC BOTTLE ONLY CRITICAL RESULT CALLED TO, READ BACK BY AND VERIFIED WITH: SCOTT  02/09/15 MKELLY     Culture   Final    STREPTOCOCCUS GROUP C METHICILLIN RESISTANT STAPHYLOCOCCUS AUREUS Performed at [facility]    Report Status 02/12/2015 FINAL  Final   Organism ID, Bacteria STREPTOCOCCUS GROUP C  Final   Organism ID, Bacteria METHICILLIN RESISTANT STAPHYLOCOCCUS AUREUS  Final      Susceptibility   Methicillin resistant staphylococcus aureus - MIC*    CIPROFLOXACIN >=8 RESISTANT Resistant     ERYTHROMYCIN >=8 RESISTANT Resistant     GENTAMICIN <=0.5 SENSITIVE Sensitive     OXACILLIN >=4 RESISTANT Resistant     TETRACYCLINE <=1 SENSITIVE Sensitive     VANCOMYCIN <=0.5 SENSITIVE Sensitive     TRIMETH/SULFA <=10 SENSITIVE  Sensitive     CLINDAMYCIN <=0.25 SENSITIVE Sensitive     RIFAMPIN <=0.5 SENSITIVE Sensitive     Inducible Clindamycin NEGATIVE Sensitive     * METHICILLIN RESISTANT STAPHYLOCOCCUS AUREUS   Streptococcus group c - MIC*    CLINDAMYCIN >=1 RESISTANT Resistant     AMPICILLIN <=0.25 SENSITIVE Sensitive     ERYTHROMYCIN >=8 RESISTANT Resistant     VANCOMYCIN 0.5 SENSITIVE Sensitive     CEFTRIAXONE <=0.12 SENSITIVE Sensitive     LEVOFLOXACIN 0.5 SENSITIVE Sensitive     * STREPTOCOCCUS GROUP C  Urine culture  Status: None   Collection Time: 02/08/15  5:52 PM  Result Value Ref Range Status   Specimen Description URINE, CLEAN CATCH  Final   Special Requests NONE  Final   Culture   Final    >=100,000 COLONIES/mL METHICILLIN RESISTANT STAPHYLOCOCCUS AUREUS Performed at [facility]    Report Status 02/10/2015 FINAL  Final   Organism ID, Bacteria METHICILLIN RESISTANT STAPHYLOCOCCUS AUREUS  Final      Susceptibility   Methicillin resistant staphylococcus aureus - MIC*    CIPROFLOXACIN >=8 RESISTANT Resistant     GENTAMICIN <=0.5 SENSITIVE Sensitive     NITROFURANTOIN <=16 SENSITIVE Sensitive     OXACILLIN >=4 RESISTANT Resistant     TETRACYCLINE <=1 SENSITIVE Sensitive     VANCOMYCIN 1 SENSITIVE Sensitive     TRIMETH/SULFA <=10 SENSITIVE Sensitive     CLINDAMYCIN <=0.25 SENSITIVE Sensitive     RIFAMPIN <=0.5 SENSITIVE Sensitive     Inducible Clindamycin NEGATIVE Sensitive     * >=100,000 COLONIES/mL METHICILLIN RESISTANT STAPHYLOCOCCUS AUREUS  Body fluid culture     Status: None   Collection Time: 02/08/15  7:42 PM  Result Value Ref Range Status   Specimen Description SYNOVIAL R KNEE  Final   Special Requests Normal  Final   Gram Stain   Final    ABUNDANT WBC PRESENT, PREDOMINANTLY PMN NO ORGANISMS SEEN Gram Stain Report Called to,Read Back By and Verified With: Ulyses Amor [id number] @ 2126 BY J SCOTTON    Culture   Final    NO GROWTH 3 DAYS Performed at [facility]    Report Status 02/12/2015 FINAL  Final  MRSA PCR Screening     Status: Abnormal   Collection Time: 02/08/15 10:41 PM  Result Value Ref Range Status   MRSA by PCR POSITIVE (A) NEGATIVE Final    Comment:        The GeneXpert MRSA Assay (FDA approved for NASAL specimens only), is one component of a comprehensive MRSA colonization surveillance program. It is not intended to diagnose MRSA infection nor to guide or monitor treatment for MRSA infections. RESULT CALLED TO, READ BACK BY AND VERIFIED WITH: CROFTS,S RN [phone] COV[place],N   Culture, blood (routine x 2)     Status: None   Collection Time: 02/09/15  2:30 PM  Result Value Ref Range Status   Specimen Description BLOOD RIGHT ANTECUBITAL  Final   Special Requests BOTTLES DRAWN AEROBIC AND ANAEROBIC 10CC  Final   Culture   Final    NO GROWTH 5 DAYS Performed at [facility]    Report Status 02/14/2015 FINAL  Final  Stool culture     Status: None   Collection Time: 02/09/15  5:36 PM  Result Value Ref Range Status   Specimen Description STOOL PERIRECTAL  Final   Special Requests Immunocompromised  Final   Culture   Final    ABUNDANT STAPHYLOCOCCUS AUREUS Note: RIFAMPIN AND GENTAMICIN SHOULD NOT BE USED AS SINGLE DRUGS FOR TREATMENT OF STAPH INFECTIONS. NO SALMONELLA, SHIGELLA, CAMPYLOBACTER, YERSINIA, OR E.COLI 0157:[id number] ISOLATED Note: REDUCED NORMAL FLORA PRESENT Performed at [facility]    Report Status 02/13/2015 FINAL  Final  Culture, blood (routine x 2)     Status: None   Collection Time: 02/10/15  3:45 PM  Result Value Ref Range Status   Specimen Description BLOOD RIGHT ARM  Final   Special Requests BOTTLES DRAWN AEROBIC AND ANAEROBIC 10 CC EA  Final   Culture   Final  NO GROWTH 5 DAYS Performed at [facility]    Report Status 02/15/2015 FINAL  Final  Culture, blood (routine x 2)     Status: None   Collection Time: 02/10/15  5:24 PM  Result Value Ref Range Status    Specimen Description BLOOD RIGHT ANTECUBITAL  Final   Special Requests BOTTLES DRAWN AEROBIC AND ANAEROBIC 10CC  Final   Culture   Final    NO GROWTH 5 DAYS Performed at [facility]    Report Status 02/15/2015 FINAL  Final  C difficile quick scan w PCR reflex     Status: None   Collection Time: 02/16/15  2:59 AM  Result Value Ref Range Status   C Diff antigen NEGATIVE NEGATIVE Final   C Diff toxin NEGATIVE NEGATIVE Final   C Diff interpretation Negative for toxigenic C. difficile  Final    Studies/Results: No results found.    Assessment/Plan:  INTERVAL HISTORY:  Patient refused surgery over Thanksgiving Holidays 02/16/15: He again refused surgery today but then after discussion with [facility] and again with me and his wife present he agreed to surgery of his knee   Active Problems:   Acute renal failure (ARF) (HCC)   Anemia   Cellulitis   Sepsis (HCC)   UTI (lower urinary tract infection)   Severe sepsis (HCC)   Staphylococcus aureus bacteremia with sepsis (HCC)   Staphylococcal arthritis of right knee (HCC)   Erythroderma   Screen for STD (sexually transmitted disease)   Poor intravenous access   Streptococcal infection group G   Cellulitis of knee    Charles [name] is a 64 y.o. male with   Hx of severe eczema admitted with septic shock due to Staphylococcus aureus and Streptococcus with septic knee, urine seeded from blood  #1 MRSAB AND Steptococcal infection  [facility] Staphylococcus aureus bacteremia   Staphylococcus aureus bacteremia (SAB) is associated with a high rate of complications and mortality. Specific aspects of clinical management are critical to optimizing the outcome of patients with SAB. Therefore, the [facility] Antimicrobial Management Team [facility]) has initiated an intervention aimed at improving the management of SAB at [facility]. To do so, Infectious Diseases  physicians are providing an evidence-based consult for the management of all patients with SAB.     Yes No Comments  Perform follow-up blood cultures (even if the patient is afebrile) to ensure clearance of bacteremia   BLOOD CULTURES TAKEN last Wednesday No Growth  Remove vascular catheter and obtain follow-up blood cultures after the removal of the catheter   PICC placed after blood cultures were taken  Perform echocardiography to evaluate for endocarditis (transthoracic ECHO is 40-50% sensitive, TEE is > 90% sensitive)   Please keep in mind, that neither test can definitively EXCLUDE endocarditis, and that should clinical suspicion remain high for endocarditis the patient should then still be treated with an "endocarditis" duration of therapy = 6 weeks TTE without vegetations, would get TEE when able  Consult electrophysiologist to evaluate implanted cardiac device (pacemaker, ICD)   NA  Ensure source control   Have all abscesses been drained effectively? Have deep seeded infections (septic joints or osteomyelitis) had appropriate surgical debridement?  The KNEE IS INFECTED AND NEEDS FORMAL SURGERY , And Dr. Victorino Dike is willing to take the patient to the OR.  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 IF HE DOES NOT THIS WILL BE A "TICKING TIME BOMB" THAT COULD PUT HIM BACK IN THE ICU   Investigate for "metastatic" sites of infection [ ]  [ ]  Does the patient have ANY symptom or physical exam finding that would suggest a deeper infection (back or neck pain that may be suggestive of vertebral osteomyelitis or epidural abscess, muscle pain that could be a symptom of pyomyositis)?  Keep in mind that for deep seeded infections MRI imaging with contrast is  preferred rather than other often insensitive tests such as plain x-rays, especially early in a patient's presentation.  SEE PREVIOUS DISCUSSION  Change antibiotic therapy to CUBICIN (See below) [ ]  [ ]  Beta-lactam antibiotics are preferred for MSSA due to higher cure rates.  If on Vancomycin, goal trough should be 15 - 20 mcg/mL  Estimated duration of IV antibiotic therapy: 6 WEEKSpostoperatively [ ]  [ ]  Consult case management for probably prolonged outpatient IV antibiotic therapy   #2 Septic knee : see above and would favor open arthrotomy. Dr Ranell PatrickNorris now states that he does not feel that patient should undergo surgery due to risks of pt scratching open post op wounds etc. I still feel strongly he should have surgery without question (he may also pull his central line out) and knee is already infected in my book  #3 MRSA in urine:  seeded from blood not other way around  #4  Diffuse rash: RN are concerned this is worse  I doubt that his antibiotics have anything to do with this but I am going to change him to IV cubicin just in case  I AGREE WITH TRANSFER TO [facility] FOR DERMATOLOGY/BURN EXPERTISE AND FOR I AND D OF KNEE WITH ID CONSULT AT INSTITUTION HE IS SET TO         Dr. Luciana Axeomer to take over service tomorrow.   LOS: 9 days   Charles [name] 02/17/2015, 6:00 PM

## 2015-02-17 NOTE — Progress Notes (Signed)
RN increasingly concerned re: state of patient's skin.  Noted marked deterioration since admission photos included in H&P on 11/21.  Pt is more drowsy and confused as well.  HR running tachy 110's.  Called RRT RN to discuss patient status.  Review history and assessment.  RRT RN to come up to evaluate shortly.

## 2015-02-17 NOTE — Progress Notes (Signed)
RRT RN at bedside to evaluate patient status.

## 2015-02-17 NOTE — Progress Notes (Signed)
Pharmacy Antibiotic Time-Out Note  Charles [name] is a 64 y.o. year-old male admitted on 02/08/2015.  The patient is currently on Vancomycin for presumed septic arthritis, MRSA and group C streptococcus bacteremia, MRSA UTI .  Assessment/Plan: Per discussion with RN this morning, Vancomycin dose had been hung about 5 minutes PRIOR to Vancomycin trough level drawn. Therefore, Vancomycin level of 35 mcg/mL does not reflect true trough level. Approximately 75% of dose had infused by the time the level resulted. Dose was subsequently stopped. Will obtain Vancomycin level at 1930 today (~ 12 hours after partial dose given this morning) and resume at appropriate dose at that time. Will plan to repeat Vancomycin true trough level in next couple days. Per ID, planned duration of therapy of antibiotics is 6 weeks.  Temp (24hrs), Avg:97.9 F (36.6 C), Min:97.5 F (36.4 C), Max:98.1 F (36.7 C)   Recent Labs Lab 02/12/15 0530 02/13/15 0256 02/14/15 0624 02/16/15 0500 02/17/15 0440  WBC 23.0* 20.3* 19.6* 25.1* 21.5*    Recent Labs Lab 02/13/15 0256 02/14/15 0624 02/15/15 0434 02/16/15 0500 02/17/15 0440  CREATININE 1.57* 1.57* 1.48* 1.62* 1.62*   Estimated Creatinine Clearance: 41.9 mL/min (by C-G formula based on Cr of 1.62).   Antimicrobial allergies: none  Antimicrobials this admission: 11/21 >> Zosyn >> 11/23  11/21 >> Vanc >> 11/23 >> Rocephin >> 11/27  Levels/dose changes this admission: 11/23: increase vanc 750mg  q12h for improved SCr and higher trough goal 11/26: 1545VT = 11 on 750mg  q12h, increase to 1250mg  q12h 11/30: 0810 VT= 35 --> see A/P above 11/30: 1730 Vancomycin random level:   Microbiology Results: 11/21 blood: 1 of 2 Group C strep, MRSA 11/21 blood: 1 of 2 Diphtheroids FINAL 11/21 urine: >100k MRSA FINAL 11/21 MRSA PCR: positive 11/21 R knee: abundant WBC, no organisms seen 11/22 blood x 2: ngf 11/22 stool: abundant staph aureus 11/23 blood x2 (repeat):  ngf 11/29: c diff negative  Thank you for allowing pharmacy to be a part of this patient's care.  Jamse MeadGadhia, Debbrah Sampedro M PharmD 02/17/2015 1:09 PM

## 2015-02-18 ENCOUNTER — Inpatient Hospital Stay (HOSPITAL_COMMUNITY): Payer: Medicare Other

## 2015-02-18 DIAGNOSIS — M1711 Unilateral primary osteoarthritis, right knee: Secondary | ICD-10-CM

## 2015-02-18 DIAGNOSIS — L409 Psoriasis, unspecified: Secondary | ICD-10-CM

## 2015-02-18 DIAGNOSIS — A4902 Methicillin resistant Staphylococcus aureus infection, unspecified site: Secondary | ICD-10-CM

## 2015-02-18 DIAGNOSIS — D72829 Elevated white blood cell count, unspecified: Secondary | ICD-10-CM

## 2015-02-18 DIAGNOSIS — L03115 Cellulitis of right lower limb: Secondary | ICD-10-CM

## 2015-02-18 DIAGNOSIS — N170 Acute kidney failure with tubular necrosis: Secondary | ICD-10-CM

## 2015-02-18 DIAGNOSIS — R21 Rash and other nonspecific skin eruption: Secondary | ICD-10-CM

## 2015-02-18 DIAGNOSIS — B954 Other streptococcus as the cause of diseases classified elsewhere: Secondary | ICD-10-CM

## 2015-02-18 DIAGNOSIS — L0291 Cutaneous abscess, unspecified: Secondary | ICD-10-CM

## 2015-02-18 LAB — BASIC METABOLIC PANEL
ANION GAP: 6 (ref 5–15)
BUN: 22 mg/dL — ABNORMAL HIGH (ref 6–20)
CALCIUM: 7.8 mg/dL — AB (ref 8.9–10.3)
CO2: 25 mmol/L (ref 22–32)
Chloride: 119 mmol/L — ABNORMAL HIGH (ref 101–111)
Creatinine, Ser: 1.55 mg/dL — ABNORMAL HIGH (ref 0.61–1.24)
GFR, EST AFRICAN AMERICAN: 52 mL/min — AB (ref >60)
GFR, EST NON AFRICAN AMERICAN: 45 mL/min — AB (ref >60)
GLUCOSE: 130 mg/dL — AB (ref 65–99)
POTASSIUM: 4.5 mmol/L (ref 3.5–5.1)
Sodium: 150 mmol/L — ABNORMAL HIGH (ref 135–145)

## 2015-02-18 LAB — VANCOMYCIN, RANDOM: VANCOMYCIN RM: 27 ug/mL

## 2015-02-18 LAB — CK: Total CK: 131 U/L (ref 49–397)

## 2015-02-18 NOTE — Progress Notes (Signed)
Brief Pharmacy Note:   Pharmacy Consult for Daptomycin Indication: Septic shock due to MRSAB and Streptococcal infection/Septic knee, MRSA UTI   See progress note by Susanne GreenhouseElizabeth Green, RPh on 11/30 for full details.   Assessment: SCr 1.55 with CrCl ~ 44 ml/min CG Afebrile WBC elevated but improved (11/30) Random Vancomycin level @ 0506: 27 mcg/ml  Plan:  Check weekly CK while on Daptomycin therapy.  Will adjust start time of Daptomycin to this PM as AM random Vancomycin level remains elevated.  Continue to monitor renal function, cultures, clinical course.    Greer PickerelJigna Shaela Boer, PharmD, BCPS Pager: [phone] 02/18/2015 10:17 AM

## 2015-02-18 NOTE — Progress Notes (Signed)
[facility]   Reason for visit: Follow up on rash, Staph aureus bacteremia, Streptococcal bacteremia, arthritis  Interval History: orthopedics does not feel knee septic based on improvement    Physical Exam: Constitutional:  Filed Vitals:   02/17/15 2314 02/18/15 0633  BP: 97/54 97/55  Pulse: 116 114  Temp: 97.3 F (36.3 C) 97.4 F (36.3 C)  Resp: 18 18   patient appears in NAD; does not respond to questions, arousable Eyes: anicteric HENT: no thrush Respiratory: Normal respiratory effort; CTA B Cardiovascular: RRR GI: soft, nt, nd Skin: diffuse pruritic, open lesions with linear marks c/w scratching; dry flakes of skin MS: right knee with minimal warmth, some mild swelling compared to left  Review of Systems: Unable to be assessed due to mental status  Lab Results  Component Value Date   WBC 21.5* 02/17/2015   HGB 8.9* 02/17/2015   HCT 28.6* 02/17/2015   MCV 87.2 02/17/2015   PLT 323 02/17/2015    Lab Results  Component Value Date   CREATININE 1.55* 02/18/2015   BUN 22* 02/18/2015   NA 150* 02/18/2015   K 4.5 02/18/2015   CL 119* 02/18/2015   CO2 25 02/18/2015    Lab Results  Component Value Date   ALT 22 02/09/2015   AST 40 02/09/2015   ALKPHOS 89 02/09/2015     Microbiology: Recent Results (from the past 240 hour(s))  Urine culture     Status: None   Collection Time: 02/08/15  5:52 PM  Result Value Ref Range Status   Specimen Description URINE, CLEAN CATCH  Final   Special Requests NONE  Final   Culture   Final    >=100,000 COLONIES/mL METHICILLIN RESISTANT STAPHYLOCOCCUS AUREUS Performed at [facility]    Report Status 02/10/2015 FINAL  Final   Organism ID, Bacteria METHICILLIN RESISTANT STAPHYLOCOCCUS AUREUS  Final      Susceptibility   Methicillin resistant staphylococcus aureus - MIC*    CIPROFLOXACIN >=8 RESISTANT Resistant     GENTAMICIN <=0.5 SENSITIVE Sensitive     NITROFURANTOIN <=16 SENSITIVE  Sensitive     OXACILLIN >=4 RESISTANT Resistant     TETRACYCLINE <=1 SENSITIVE Sensitive     VANCOMYCIN 1 SENSITIVE Sensitive     TRIMETH/SULFA <=10 SENSITIVE Sensitive     CLINDAMYCIN <=0.25 SENSITIVE Sensitive     RIFAMPIN <=0.5 SENSITIVE Sensitive     Inducible Clindamycin NEGATIVE Sensitive     * >=100,000 COLONIES/mL METHICILLIN RESISTANT STAPHYLOCOCCUS AUREUS  Body fluid culture     Status: None   Collection Time: 02/08/15  7:42 PM  Result Value Ref Range Status   Specimen Description SYNOVIAL R KNEE  Final   Special Requests Normal  Final   Gram Stain   Final    ABUNDANT WBC PRESENT, PREDOMINANTLY PMN NO ORGANISMS SEEN Gram Stain Report Called to,Read Back By and Verified With: Ulyses Amor [id number] @ 2126 BY J SCOTTON    Culture   Final    NO GROWTH 3 DAYS Performed at [facility]    Report Status 02/12/2015 FINAL  Final  MRSA PCR Screening     Status: Abnormal   Collection Time: 02/08/15 10:41 PM  Result Value Ref Range Status   MRSA by PCR POSITIVE (A) NEGATIVE Final    Comment:        The GeneXpert MRSA Assay (FDA approved for NASAL specimens only), is one component of a comprehensive MRSA colonization surveillance program. It is not intended  to diagnose MRSA infection nor to guide or monitor treatment for MRSA infections. RESULT CALLED TO, READ BACK BY AND VERIFIED WITH: CROFTS,S RN [phone] [id number] COVINGTON,N   Culture, blood (routine x 2)     Status: None   Collection Time: 02/09/15  2:30 PM  Result Value Ref Range Status   Specimen Description BLOOD RIGHT ANTECUBITAL  Final   Special Requests BOTTLES DRAWN AEROBIC AND ANAEROBIC 10CC  Final   Culture   Final    NO GROWTH 5 DAYS [facility]    Report Status 02/14/2015 FINAL  Final  Stool culture     Status: None   Collection Time: 02/09/15  5:36 PM  Result Value Ref Range Status   Specimen Description STOOL PERIRECTAL  Final   Special Requests Immunocompromised  Final    Culture   Final    ABUNDANT STAPHYLOCOCCUS AUREUS Note: RIFAMPIN AND GENTAMICIN SHOULD NOT BE USED AS SINGLE DRUGS FOR TREATMENT OF STAPH INFECTIONS. NO SALMONELLA, SHIGELLA, CAMPYLOBACTER, YERSINIA, OR E.COLI 0157:H7 ISOLATED Note: REDUCED NORMAL FLORA PRESENT Performed at [facility]    Report Status 02/13/2015 FINAL  Final  Culture, blood (routine x 2)     Status: None   Collection Time: 02/10/15  3:45 PM  Result Value Ref Range Status   Specimen Description BLOOD RIGHT ARM  Final   Special Requests BOTTLES DRAWN AEROBIC AND ANAEROBIC 10 CC EA  Final   Culture   Final    NO GROWTH 5 DAYS Performed at [facility]    Report Status 02/15/2015 FINAL  Final  Culture, blood (routine x 2)     Status: None   Collection Time: 02/10/15  5:24 PM  Result Value Ref Range Status   Specimen Description BLOOD RIGHT ANTECUBITAL  Final   Special Requests BOTTLES DRAWN AEROBIC AND ANAEROBIC 10CC  Final   Culture   Final    NO GROWTH 5 DAYS Performed at [facility]    Report Status 02/15/2015 FINAL  Final  C difficile quick scan w PCR reflex     Status: None   Collection Time: 02/16/15  2:59 AM  Result Value Ref Range Status   C Diff antigen NEGATIVE NEGATIVE Final   C Diff toxin NEGATIVE NEGATIVE Final   C Diff interpretation Negative for toxigenic C. difficile  Final    Impression/Plan:  1. MRSA bacteremia - on daptomycin now with increased creat on vancomycin.  With rash, I agree that prolonged course of 6 weeks IV indicated through January 3rd.  TTE ok.  Likely source is from the skin.  Repeat blood cultures negative.    2. 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  Doubt SJs.  A biopsy could secure the diagnosis.  He has a dermatology appt this month.    Would avoid immunosuppressives at this time.    3. Arthritis - high WBC but not over 100,000 on aspiration (61,000) and negative culture.  Unclear etiology but does not seem significantly warm now and likely not improved just from antibiotics so I also suspect not septic arthritis.    4. AKI - likely from vancomycin.  Now off but trough noted at 27 today.

## 2015-02-18 NOTE — Progress Notes (Signed)
[id number]/Rhonda Davis,RN,BSN,CCM: Chart review performed for level of care and discharge needs.

## 2015-02-18 NOTE — Progress Notes (Signed)
   02/18/15 1400  Clinical Encounter Type  Visited With Patient  Visit Type Initial;Psychological support;Social support  Referral From Other (Comment) (Rounding, LOS, pt's previous refusal of sx.)    Chaplain saw pt while rounding on unit due to length of stay, prior reluctance to pursue sx on knee.   Pt lying in bed on chaplain arrival.  Welcoming of chaplain presence, lethargic with slurred speech.  Chaplain provided supportive presence.  Pt conversation difficult to discern, but pt reported pain and itching.

## 2015-02-18 NOTE — Progress Notes (Signed)
Progress Note   Charles [name] ELF:[id number] DOB: 26-Jun-1950 DOA: 02/08/2015 PCP: Elyn Peers, MD   Brief Narrative:   Charles [name] is an 64 y.o. male with h/o eczema presented with 24 hours of right knee pain. He was found to be in sepsis from MRSA UTI and right knee infection. He was started on vancomycin and zosyn. Orthopedics & ID subsequently consulted.   Culture Data: BCUX x 2 02/08/15: Diptheroids in one bottle, MRSA and group C streptococcus in other bottle. Urine culture 02/08/15: MRSA Right knee synovial fluid culture: Negative BCUX x 2 02/09/15: Negative    Assessment/Plan:   Principal problem:  Sepsis secondary to urinary tract infection with hematuria/MRSA and group C streptococcus bacteremia - Met criteria for sepsis on admission, source: Urinary tract, urine culture + MRSA sensitive to vancomyin.  - Blood culture 2: GPC and rods,One bottle grew MRSA and group c streptococcus.  - Repeat cultures ordered and have been negative so far.  - Echocardiogram showed Good LVEF, no vegetations seen.  - Antibiotics switched to Cubicin from vancomycin, rocephin due to concerns of worsening skin condition. - He will need 6 weeks of antibiotics for presumed septic arthritis. - Orthopedic surgeon continues to follow with no current recommendations for surgery given high risk of complications.  Active problems:  Hypoglycemia - Noted on admission. Resolved.  Hypernatremia/hyperkalemia - Hypotonic IV fluids started at 100 mL/h 02/17/15. Still hypernatremic today. Increase IV fluids to 125 mL per hour.  Acute kidney injury versus stage III chronic kidney disease - Likely has stage III chronic kidney disease given relative stability of creatinine in the 1.5-1.7 rangesince admission. - Baseline creatinine not documented. Creatinine slightly improved today after starting IV fluids yesterday.  Right knee pain - Patient status post right knee aspiration which showed  66,000 WBC, synovial fluid cultures negative to date. - Orthopedic surgeon following, no current recommendations for surgical intervention given high risk. - Status post PT/OT consultations, SNF recommended. - Open arthrotomy recommended by ID.  Continue Cubicin per ID recommendations.  ?Psoriasis versus erythroderma  - Patient needs to follow up dermatology as an outpatient - Pt's primary dermatologist was contacted.  - Per dermatology, he has a history of noncompliance of treatment x 3 years and was supposed to be on methotrexate with topical triamcinolone.  - Discussed with ID and it is OK to give methotrexate while here. Given one dose and started on triamcinolone. - Given severity of skin eruption, would transfer to [facility] for urgent dermatology consultation. - Unfortunately, no beds available at [facility]. - Spoke to surgery with plans to proceed with skin biopsy 02/19/15.  Sinus tachycardia - Likely secondary to sepsis, improved.  - TSH 2.273.  Transient hypotension - Resolved with IVF, thought to be from sepsis.  Normocytic anemia  - Unknown baseline hemoglobin, stable hemoglobin.  - Continue to monitor CBC. - Anemia panel shows iron of 12, ferritin of 80. - Stool for occult blood is negative.   Poor IV Access - PICC line put in, but it came out. - Tunnelled PICC line placed.   Leukocytosis: slowly improving - Secondary to infection/inflammation.    DVT Prophylaxis - SCDs ordered.   Family Communication/Anticipated D/C date and plan/Code Status   Family Communication: No family currently at the bedside. Disposition Plan: Home when stable. Anticipated D/C date:   Critically ill, unable to determine, with ongoing need for IV antibiotics.   Code Status: Full code.  IV Access:    PICC line placed 02/14/15  Procedures and diagnostic studies:   Dg Chest Port 1 View  02-21-2015  CLINICAL DATA:  64 year old male with fever EXAM: PORTABLE CHEST 1 VIEW COMPARISON:   Right knee radiographs obtained concurrently FINDINGS: Heart is upper limits of normal for size. Mediastinal contours are within normal limits. Atherosclerotic calcifications are present in the transverse aorta. The lungs are clear. No pleural effusion or pneumothorax. No acute osseous abnormality. IMPRESSION: 1. Borderline cardiomegaly. 2. Aortic atherosclerosis. 3. No acute cardiopulmonary process. Electronically Signed   By: Jacqulynn Cadet M.D.   On: February 21, 2015 17:50   Dg Knee Right Port  Feb 21, 2015  CLINICAL DATA:  64 year old male with 1 day history of right knee pain and low-grade fever EXAM: PORTABLE RIGHT KNEE - 1-2 VIEW COMPARISON:  Prior radiographs of the lower leg including the ankle 09/27/2010 FINDINGS: Positive for small ankle joint effusion. No evidence of acute fracture or malalignment. Normal bony mineralization. No lytic or blastic osseous lesion. No periosteal reaction. Mild tricompartmental degenerative change. IMPRESSION: 1. Positive for small suprapatellar knee joint effusion. Septic arthritis should be considered within the differential given the clinical history of fever. Additionally, degenerative and inflammatory effusions are considerations. 2. Mild tricompartmental osteoarthritis. Electronically Signed   By: Jacqulynn Cadet M.D.   On: February 21, 2015 17:52     Medical Consultants:    ID: Truman Hayward, MD  Orthopedic Surgery: Netta Cedars, MD  Anti-Infectives:    Cubicin 02/17/15--->  Vancomycin Feb 21, 2015---> 02/17/15  Rocephin 02/10/15---> 02/17/15  Zosyn 2015-02-21---> 02/10/15   Subjective:   Charles [name] is more awake/alert today, eating fair. Says he is having 6/10 pain in his knee. No nausea or vomiting.    Objective:    Filed Vitals:   02/17/15 0644 02/17/15 1500 02/17/15 2314 02/18/15 0633  BP:  _0  Pulse: 117 108 116 114  Temp:  97.8 F (36.6 C) 97.3 F (36.3 C) 97.4 F (36.3 C)  TempSrc:  Axillary Oral Oral  Resp:  _1 Height:      Weight:      SpO2: 95% 97% 100% 99%    Intake/Output Summary (Last 24 hours) at 02/18/15 1810 Last data filed at 02/18/15 0855  Gross per 24 hour  Intake 941.67 ml  Output    500 ml  Net 441.67 ml   Filed Weights   02-21-2015 1619 02/21/2015 2252  Weight: 70.308 kg (155 lb) 77.9 kg (171 lb 11.8 oz)    Exam: Gen:  More awake today Cardiovascular:  Tachy, No M/R/G Respiratory:  Lungs CTAB Gastrointestinal:  Abdomen soft, NT/ND, + BS Extremities:  No C/E/C Skin: No change:         Data Reviewed:    Labs: Basic Metabolic Panel:  Recent Labs Lab 02/14/15 0624 02/15/15 0434 02/16/15 0500 02/17/15 0440 02/18/15 0900  NA 145 141 144 148* 150*  K 5.0 4.6 5.0 5.4* 4.5  CL 116* 113* 113* 119* 119*  CO2 21* 21* _2 GLUCOSE 87 87 95 107* 130*  BUN 20 23* 24* 24* 22*  CREATININE 1.57* 1.48* 1.62* 1.62* 1.55*  CALCIUM 7.9* 7.7* 7.7* 7.8* 7.8*   GFR Estimated Creatinine Clearance: 43.8 mL/min (by C-G formula based on Cr of 1.55).  CBC:  Recent Labs Lab 02/12/15 0530 02/13/15 0256 02/14/15 0624 02/16/15 0500 02/17/15 0440  WBC 23.0* 20.3* 19.6* 25.1* 21.5*  HGB 10.1* 9.4* 10.2* 9.2* 8.9*  HCT 32.0* 30.0* 33.3* 29.8* 28.6*  MCV 87.7 87.7 89.0 87.4 87.2  PLT 443* 418* 367 343 323   CBG:  Recent Labs Lab 02/12/15 0835 02/12/15 0913 02/12/15 1006  GLUCAP 35* 63* 93   Sepsis Labs:  Recent Labs Lab 02/12/15 0530 02/13/15 0256 02/14/15 0624 02/16/15 0500 02/17/15 0440  WBC 23.0* 20.3* 19.6* 25.1* 21.5*  LATICACIDVEN 2.0  --   --   --   --    Microbiology Recent Results (from the past 240 hour(s))  Body fluid culture     Status: None   Collection Time: 02/08/15  7:42 PM  Result Value Ref Range Status   Specimen Description SYNOVIAL R KNEE  Final   Special Requests Normal  Final   Gram Stain   Final    ABUNDANT WBC PRESENT, PREDOMINANTLY PMN NO ORGANISMS SEEN Gram Stain Report Called to,Read Back By and Verified With:  Mort Sawyers [id number] @ 2126 BY J SCOTTON    Culture   Final    NO GROWTH 3 DAYS Performed at [facility]    Report Status 02/12/2015 FINAL  Final  MRSA PCR Screening     Status: Abnormal   Collection Time: 02/08/15 10:41 PM  Result Value Ref Range Status   MRSA by PCR POSITIVE (A) NEGATIVE Final    Comment:        The GeneXpert MRSA Assay (FDA approved for NASAL specimens only), is one component of a comprehensive MRSA colonization surveillance program. It is not intended to diagnose MRSA infection nor to guide or monitor treatment for MRSA infections. RESULT CALLED TO, READ BACK BY AND VERIFIED WITH: CROFTS,S RN [phone] COVINGTON,N   Culture, blood (routine x 2)     Status: None   Collection Time: 02/09/15  2:30 PM  Result Value Ref Range Status   Specimen Description BLOOD RIGHT ANTECUBITAL  Final   Special Requests BOTTLES DRAWN AEROBIC AND ANAEROBIC 10CC  Final   Culture   Final    NO GROWTH 5 DAYS Performed at [facility]    Report Status 02/14/2015 FINAL  Final  Stool culture     Status: None   Collection Time: 02/09/15  5:36 PM  Result Value Ref Range Status   Specimen Description STOOL PERIRECTAL  Final   Special Requests Immunocompromised  Final   Culture   Final    ABUNDANT STAPHYLOCOCCUS AUREUS Note: RIFAMPIN AND GENTAMICIN SHOULD NOT BE USED AS SINGLE DRUGS FOR TREATMENT OF STAPH INFECTIONS. NO SALMONELLA, SHIGELLA, CAMPYLOBACTER, YERSINIA, OR E.COLI 0157:H7 ISOLATED Note: REDUCED NORMAL FLORA PRESENT Performed at [facility]    Report Status 02/13/2015 FINAL  Final  Culture, blood (routine x 2)     Status: None   Collection Time: 02/10/15  3:45 PM  Result Value Ref Range Status   Specimen Description BLOOD RIGHT ARM  Final   Special Requests BOTTLES DRAWN AEROBIC AND ANAEROBIC 10 CC EA  Final   Culture   Final    NO GROWTH 5 DAYS Performed at [facility]    Report Status 02/15/2015 FINAL  Final  Culture,  blood (routine x 2)     Status: None   Collection Time: 02/10/15  5:24 PM  Result Value Ref Range Status   Specimen Description BLOOD RIGHT ANTECUBITAL  Final   Special Requests BOTTLES DRAWN AEROBIC AND ANAEROBIC 10CC  Final   Culture   Final    NO GROWTH 5 DAYS Performed at [facility]    Report Status 02/15/2015 FINAL  Final  C difficile quick scan w PCR  reflex     Status: None   Collection Time: 02/16/15  2:59 AM  Result Value Ref Range Status   C Diff antigen NEGATIVE NEGATIVE Final   C Diff toxin NEGATIVE NEGATIVE Final   C Diff interpretation Negative for toxigenic C. difficile  Final     Medications:   . DAPTOmycin (CUBICIN)  IV  500 mg Intravenous Q24H  . feeding supplement (ENSURE ENLIVE)  237 mL Oral BID BM  . folic acid  1 mg Oral Daily  . hydrocerin   Topical BID  . hydrOXYzine  25 mg Oral TID  . sodium chloride  3 mL Intravenous Q12H  . triamcinolone ointment   Topical BID   Continuous Infusions: . sodium chloride 100 mL/hr at 02/17/15 0850    Time spent: 35 minutes.  The patient is medically complex with multiple co-morbidities and is at high risk for clinical deterioration and requires high complexity decision making.    LOS: 10 days   [facility] Pager [phone]. If unable to reach me by pager, please call my cell phone at [phone].  *Please refer to [email or web address], password [email or web address] to get updated schedule on who will round on this patient, as hospitalists switch teams weekly. If 7PM-7AM, please contact night-coverage at [email or web address], password [email or web address] for any overnight needs.  02/18/2015, 6:10 PM

## 2015-02-18 NOTE — Progress Notes (Signed)
Orthopedics Progress Note  Subjective: Patient reports significant improvement in right knee pain over last two days.  Objective:  Filed Vitals:   02/17/15 2314 02/18/15 0633  BP: 97/54 97/55  Pulse: 116 114  Temp: 97.3 F (36.3 C) 97.4 F (36.3 C)  Resp: 18 18    General: Awake and alert  Musculoskeletal: No effusion today and no tenderness over the joint in the suprapatellar area or posteriorly.  There is no focal erythema or warmth to the knee.  This represents a significant improvement over the last few days and a completely changed exam from admission. AROM 0-90 with minimal pain. Neurovascularly intact  Lab Results  Component Value Date   WBC 21.5* 02/17/2015   HGB 8.9* 02/17/2015   HCT 28.6* 02/17/2015   MCV 87.2 02/17/2015   PLT 323 02/17/2015       Component Value Date/Time   NA 150* 02/18/2015 0900   K 4.5 02/18/2015 0900   CL 119* 02/18/2015 0900   CO2 25 02/18/2015 0900   GLUCOSE 130* 02/18/2015 0900   BUN 22* 02/18/2015 0900   CREATININE 1.55* 02/18/2015 0900   CALCIUM 7.8* 02/18/2015 0900   GFRNONAA 45* 02/18/2015 0900   GFRAA 52* 02/18/2015 0900    Lab Results  Component Value Date   INR 1.55* 02/09/2015   INR 1.63* 02/09/2015    Assessment/Plan: At this point we are seeing dramatic clinical improvement in the right knee swelling and pain which I would not expect if this was a septic knee joint.  I am very confident that this is not a septic knee at this point and recommend mobilization as tolerated now WBAT on the right knee. His cultures remain no growth from the synovial fluid aspiration on admission. He has no effusion and excellent ROM now!  I have spoken with Dr Daiva EvesVan Dam today to relay our findings and recommendation for continued observation.  Will be available to see Mr Stephannie PetersMilton again should his condition change for the worse. Please feel free to contact me with any questions.  Almedia BallsSteven R. Ranell PatrickNorris, MD 02/18/2015 1:47 PM  [phone]

## 2015-02-19 ENCOUNTER — Encounter (HOSPITAL_COMMUNITY): Payer: Self-pay | Admitting: General Surgery

## 2015-02-19 DIAGNOSIS — L03119 Cellulitis of unspecified part of limb: Secondary | ICD-10-CM

## 2015-02-19 LAB — CBC
HEMATOCRIT: 30.1 % — AB (ref 39.0–52.0)
HEMOGLOBIN: 9.3 g/dL — AB (ref 13.0–17.0)
MCH: 27.9 pg (ref 26.0–34.0)
MCHC: 30.9 g/dL (ref 30.0–36.0)
MCV: 90.4 fL (ref 78.0–100.0)
Platelets: 314 10*3/uL (ref 150–400)
RBC: 3.33 MIL/uL — ABNORMAL LOW (ref 4.22–5.81)
RDW: 19.6 % — ABNORMAL HIGH (ref 11.5–15.5)
WBC: 17.9 10*3/uL — ABNORMAL HIGH (ref 4.0–10.5)

## 2015-02-19 LAB — BASIC METABOLIC PANEL
Anion gap: 5 (ref 5–15)
BUN: 20 mg/dL (ref 6–20)
CHLORIDE: 116 mmol/L — AB (ref 101–111)
CO2: 24 mmol/L (ref 22–32)
Calcium: 7.7 mg/dL — ABNORMAL LOW (ref 8.9–10.3)
Creatinine, Ser: 1.46 mg/dL — ABNORMAL HIGH (ref 0.61–1.24)
GFR calc non Af Amer: 48 mL/min — ABNORMAL LOW (ref >60)
GFR, EST AFRICAN AMERICAN: 56 mL/min — AB (ref >60)
Glucose, Bld: 107 mg/dL — ABNORMAL HIGH (ref 65–99)
POTASSIUM: 4.2 mmol/L (ref 3.5–5.1)
SODIUM: 145 mmol/L (ref 135–145)

## 2015-02-19 MED ORDER — LIDOCAINE HCL (PF) 2 % IJ SOLN
0.0000 mL | Freq: Once | INTRAMUSCULAR | Status: DC | PRN
  Filled 2015-02-19 (×2): qty 20

## 2015-02-19 NOTE — Progress Notes (Signed)
Progress Note   Lucah Petta CWU:[id number] DOB: 1950-09-22 DOA: 02/25/15 PCP: Elyn Peers, MD   Brief Narrative:   Dylin Breeden is an 64 y.o. male with h/o eczema presented with 24 hours of right knee pain. He was found to be in sepsis from MRSA UTI and right knee infection. He was started on vancomycin and zosyn. Orthopedics & ID subsequently consulted.   Culture Data: BCUX x 2 02-25-2015: Diptheroids in one bottle, MRSA and group C streptococcus in other bottle. Urine culture 02-25-2015: MRSA Right knee synovial fluid culture: Negative BCUX x 2 02/09/15: Negative    Assessment/Plan:   Principal problem:  Sepsis secondary to urinary tract infection with hematuria/MRSA and group C streptococcus bacteremia - Met criteria for sepsis on admission, source: Urinary tract, urine culture + MRSA sensitive to vancomyin.  - Blood culture 2: GPC and rods,One bottle grew MRSA and group c streptococcus.  - Repeat cultures ordered and have been negative so far.  - Echocardiogram showed Good LVEF, no vegetations seen.  - Antibiotics switched to Cubicin from vancomycin, rocephin due to concerns of worsening skin condition. - He will need 6 weeks of antibiotics for presumed septic arthritis. - Orthopedic surgeon continues to follow with no current recommendations for surgery given high risk of complications.  Active problems:  Hypoglycemia - Noted on admission. Resolved.  Hypernatremia/hyperkalemia - Sodium normalizing with hypotonic IV fluids.  Acute kidney injury versus stage III chronic kidney disease - Likely has stage III chronic kidney disease given relative stability of creatinine in the 1.5-1.7 rangesince admission. - Baseline creatinine not documented. Creatinine trending down after starting IV fluids 02/17/15.  Right knee pain - Patient status post right knee aspiration which showed 66,000 WBC, synovial fluid cultures negative to date. - Orthopedic surgeon  following, no current recommendations for surgical intervention given high risk. - Status post PT/OT consultations, SNF recommended. - Open arthrotomy recommended by ID.  Continue Cubicin per ID recommendations.  ?Psoriasis versus erythroderma  - Patient needs to follow up dermatology as an outpatient - Pt's primary dermatologist was contacted.  - Per dermatology, he has a history of noncompliance of treatment x 3 years and was supposed to be on methotrexate with topical triamcinolone.  - Discussed with ID and it is OK to give methotrexate while here. Given one dose and started on triamcinolone. - Given severity of skin eruption, would transfer to [facility] for urgent dermatology consultation. - Unfortunately, no beds available at [facility]. - Spoke to surgery with plans to proceed with skin biopsy 02/19/15.  Sinus tachycardia - Likely secondary to sepsis, improved.  - TSH 2.273.  Transient hypotension - Resolved with IVF, thought to be from sepsis.  Normocytic anemia  - Unknown baseline hemoglobin, stable hemoglobin.  - Continue to monitor CBC. - Anemia panel shows iron of 12, ferritin of 80. - Stool for occult blood is negative.   Poor IV Access - PICC line put in, but it came out. - Tunnelled PICC line placed.   Leukocytosis: slowly improving - Secondary to infection/inflammation.    DVT Prophylaxis - SCDs ordered.   Family Communication/Anticipated D/C date and plan/Code Status   Family Communication: No family currently at the bedside. Disposition Plan: Home when stable. Anticipated D/C date:   Critically ill, unable to determine, with ongoing need for IV antibiotics.   Code Status: Full code.  IV Access:    PICC line placed 02/14/15  Procedures and diagnostic studies:   Dg Chest Port 1 View  02-25-15  CLINICAL DATA:  64 year old male with fever EXAM: PORTABLE CHEST 1 VIEW COMPARISON:  Right knee radiographs obtained concurrently FINDINGS: Heart is upper limits  of normal for size. Mediastinal contours are within normal limits. Atherosclerotic calcifications are present in the transverse aorta. The lungs are clear. No pleural effusion or pneumothorax. No acute osseous abnormality. IMPRESSION: 1. Borderline cardiomegaly. 2. Aortic atherosclerosis. 3. No acute cardiopulmonary process. Electronically Signed   By: Jacqulynn Cadet M.D.   On: 02/08/2015 17:50   Dg Knee Right Port  02/08/2015  CLINICAL DATA:  64 year old male with 1 day history of right knee pain and low-grade fever EXAM: PORTABLE RIGHT KNEE - 1-2 VIEW COMPARISON:  Prior radiographs of the lower leg including the ankle 09/27/2010 FINDINGS: Positive for small ankle joint effusion. No evidence of acute fracture or malalignment. Normal bony mineralization. No lytic or blastic osseous lesion. No periosteal reaction. Mild tricompartmental degenerative change. IMPRESSION: 1. Positive for small suprapatellar knee joint effusion. Septic arthritis should be considered within the differential given the clinical history of fever. Additionally, degenerative and inflammatory effusions are considerations. 2. Mild tricompartmental osteoarthritis. Electronically Signed   By: Jacqulynn Cadet M.D.   On: 02/08/2015 17:52     Medical Consultants:    ID: Truman Hayward, MD  Orthopedic Surgery: Netta Cedars, MD  Anti-Infectives:    Cubicin 02/17/15--->  Vancomycin 02/08/15---> 02/17/15  Rocephin 02/10/15---> 02/17/15  Zosyn 02/08/15---> 02/10/15   Subjective:   Kam Rahimi is more awake/alert today, eating fair. No nausea or vomiting.  Still having knee pain. Says his skin has a burning sensation.  Objective:    Filed Vitals:   02/18/15 2122 02/18/15 2124 02/18/15 2142 02/19/15 0541  BP: 177/84 164/85 100/60 111/65  Pulse: 97  113 118  Temp: 98.2 F (36.8 C)  97.5 F (36.4 C) 97.5 F (36.4 C)  TempSrc:   Oral Oral  Resp: $Remo'20  20 20  'zqRSq$ Height:      Weight:      SpO2: 100%  100% 100%     Intake/Output Summary (Last 24 hours) at 02/19/15 0819 Last data filed at 02/18/15 1833  Gross per 24 hour  Intake      0 ml  Output   1000 ml  Net  -1000 ml   Filed Weights   02/08/15 1619 02/08/15 2252  Weight: 70.308 kg (155 lb) 77.9 kg (171 lb 11.8 oz)    Exam: Gen:  More awake today Cardiovascular:  Tachy, No M/R/G Respiratory:  Lungs CTAB Gastrointestinal:  Abdomen soft, NT/ND, + BS Extremities:  No C/E/C Skin: No change:  Data Reviewed:    Labs: Basic Metabolic Panel:  Recent Labs Lab 02/15/15 0434 02/16/15 0500 02/17/15 0440 02/18/15 0900 02/19/15 0418  NA 141 144 148* 150* 145  K 4.6 5.0 5.4* 4.5 4.2  CL 113* 113* 119* 119* 116*  CO2 21* $Remov'24 23 25 24  'bDqBKK$ GLUCOSE 87 95 107* 130* 107*  BUN 23* 24* 24* 22* 20  CREATININE 1.48* 1.62* 1.62* 1.55* 1.46*  CALCIUM 7.7* 7.7* 7.8* 7.8* 7.7*   GFR Estimated Creatinine Clearance: 46.5 mL/min (by C-G formula based on Cr of 1.46).  CBC:  Recent Labs Lab 02/13/15 0256 02/14/15 0624 02/16/15 0500 02/17/15 0440 02/19/15 0418  WBC 20.3* 19.6* 25.1* 21.5* 17.9*  HGB 9.4* 10.2* 9.2* 8.9* 9.3*  HCT 30.0* 33.3* 29.8* 28.6* 30.1*  MCV 87.7 89.0 87.4 87.2 90.4  PLT 418* 367 343 323 314   CBG:  Recent Labs Lab 02/12/15 0835  02/12/15 0913 02/12/15 1006  GLUCAP 35* 63* 93   Sepsis Labs:  Recent Labs Lab 02/14/15 0624 02/16/15 0500 02/17/15 0440 02/19/15 0418  WBC 19.6* 25.1* 21.5* 17.9*   Microbiology Recent Results (from the past 240 hour(s))  Culture, blood (routine x 2)     Status: None   Collection Time: 02/09/15  2:30 PM  Result Value Ref Range Status   Specimen Description BLOOD RIGHT ANTECUBITAL  Final   Special Requests BOTTLES DRAWN AEROBIC AND ANAEROBIC 10CC  Final   Culture   Final    NO GROWTH 5 DAYS Performed at [facility]    Report Status 02/14/2015 FINAL  Final  Stool culture     Status: None   Collection Time: 02/09/15  5:36 PM  Result Value Ref Range Status    Specimen Description STOOL PERIRECTAL  Final   Special Requests Immunocompromised  Final   Culture   Final    ABUNDANT STAPHYLOCOCCUS AUREUS Note: RIFAMPIN AND GENTAMICIN SHOULD NOT BE USED AS SINGLE DRUGS FOR TREATMENT OF STAPH INFECTIONS. NO SALMONELLA, SHIGELLA, CAMPYLOBACTER, YERSINIA, OR E.COLI 0157:H7 ISOLATED Note: REDUCED NORMAL FLORA PRESENT Performed at [facility]    Report Status 02/13/2015 FINAL  Final  Culture, blood (routine x 2)     Status: None   Collection Time: 02/10/15  3:45 PM  Result Value Ref Range Status   Specimen Description BLOOD RIGHT ARM  Final   Special Requests BOTTLES DRAWN AEROBIC AND ANAEROBIC 10 CC EA  Final   Culture   Final    NO GROWTH 5 DAYS Performed at [facility]    Report Status 02/15/2015 FINAL  Final  Culture, blood (routine x 2)     Status: None   Collection Time: 02/10/15  5:24 PM  Result Value Ref Range Status   Specimen Description BLOOD RIGHT ANTECUBITAL  Final   Special Requests BOTTLES DRAWN AEROBIC AND ANAEROBIC 10CC  Final   Culture   Final    NO GROWTH 5 DAYS Performed at [facility]    Report Status 02/15/2015 FINAL  Final  C difficile quick scan w PCR reflex     Status: None   Collection Time: 02/16/15  2:59 AM  Result Value Ref Range Status   C Diff antigen NEGATIVE NEGATIVE Final   C Diff toxin NEGATIVE NEGATIVE Final   C Diff interpretation Negative for toxigenic C. difficile  Final     Medications:   . DAPTOmycin (CUBICIN)  IV  500 mg Intravenous Q24H  . feeding supplement (ENSURE ENLIVE)  237 mL Oral BID BM  . folic acid  1 mg Oral Daily  . hydrocerin   Topical BID  . hydrOXYzine  25 mg Oral TID  . sodium chloride  3 mL Intravenous Q12H  . triamcinolone ointment   Topical BID   Continuous Infusions: . sodium chloride 125 mL/hr at 02/19/15 0126    Time spent: 35 minutes.  The patient is medically complex with multiple co-morbidities and is at high risk for clinical deterioration  and requires high complexity decision making.    LOS: 11 days   [facility] Pager [phone]. If unable to reach me by pager, please call my cell phone at [phone].  *Please refer to [email or web address], password [email or web address] to get updated schedule on who will round on this patient, as hospitalists switch teams weekly. If 7PM-7AM, please contact night-coverage at [email or web address], password [email or web address] for any overnight needs.  02/19/2015, 8:19 AM

## 2015-02-19 NOTE — Progress Notes (Signed)
Physical Therapy Treatment Patient Details Name: Charles [name] MRN: [id number] DOB: 22-Feb-1951 Today's Date: 02/19/2015    History of Present Illness 64 y/o male admitted with severe eczymya and sepsis, + for UTI. R knee effusion aspirated two days ago before abx started.    PT Comments    Significant progress with mobility today.  Pt ambulated 24' with RW with min/guard assist, he denied R knee pain.   Follow Up Recommendations  SNF     Equipment Recommendations  None recommended by PT    Recommendations for Other Services       Precautions / Restrictions Precautions Precautions: Fall Restrictions Weight Bearing Restrictions: No    Mobility  Bed Mobility Overal bed mobility: Needs Assistance Bed Mobility: Supine to Sit     Supine to sit: Mod assist;Min assist     General bed mobility comments: increased time, min A to initiate movement and to pivot hips to EOB with pad, pt able to elevate trunk pushing up with bedrail  Transfers Overall transfer level: Needs assistance Equipment used: Rolling walker (2 wheeled) Transfers: Sit to/from Stand Sit to Stand: Min guard         General transfer comment: VCs for hand placement, min/guard for safety  Ambulation/Gait Ambulation/Gait assistance: Min guard Ambulation Distance (Feet): 24 Feet Assistive device: Rolling walker (2 wheeled) Gait Pattern/deviations: Step-through pattern   Gait velocity interpretation: at or above normal speed for age/gender General Gait Details: steady with RW, pt walked to bathroom and then to recliner, he denied pain with ambulation   Stairs            Wheelchair Mobility    Modified Rankin (Stroke Patients Only)       Balance     Sitting balance-Leahy Scale: Good       Standing balance-Leahy Scale: Fair                      Cognition Arousal/Alertness: Awake/alert Behavior During Therapy: Flat affect Overall Cognitive Status: Within Functional Limits  for tasks assessed                      Exercises      General Comments        Pertinent Vitals/Pain Pain Assessment: No/denies pain    Home Living                      Prior Function            PT Goals (current goals can now be found in the care plan section) Acute Rehab PT Goals Patient Stated Goal: to be able to walk, less pain PT Goal Formulation: With patient Time For Goal Achievement: 02/25/15 Potential to Achieve Goals: Fair Progress towards PT goals: Progressing toward goals    Frequency  Min 3X/week    PT Plan Current plan remains appropriate    Co-evaluation             End of Session   Activity Tolerance: Patient limited by fatigue Patient left: in chair;with call bell/phone within reach;with chair alarm set;with family/visitor present     Time: 1209-1230 PT Time Calculation (min) (ACUTE ONLY): 21 min  Charges:  $Gait Training: 8-22 mins                    G Codes:      Charles [name], Charles [name] 02/19/2015, 12:52 PM [phone]

## 2015-02-19 NOTE — Procedures (Signed)
Punch Biopsy Procedure Note  Pre-operative Diagnosis: unidentified skin lesions  Post-operative Diagnosis: same  Indications: This is a 64 yo black male who began having skin problems in the summer.  He has seen a dermatologist as an outpatient and states he had a biopsy that did not reveal anything specific.  This has progressively worsened.  He is currently in the hospital for several other problems, of which, this remains of them.  We have been asked to see him for a punch biopsy.  Anesthesia: 2% plain lidocaine  Procedure Details  The procedure, risks and complications have been discussed in detail (including infection, bleeding) with the patient, and the patient has given verbal consent to the procedure.  The skin was prepped with betadine. After adequate local anesthesia, a 4mm punch device was used in the right anterior thigh.  The patient was observed until hemostasis achieved.  Dry gauze dressing placed over wound.  Findings: Skin lesions  EBL: <5 cc's   Condition: Tolerated procedure well and Stable   Complications: none.  Eeshan Verbrugge E 02/19/2015 11:20 AM

## 2015-02-19 NOTE — Progress Notes (Signed)
[facility]   Reason for visit: Follow up on rash, Staph aureus bacteremia, Streptococcal bacteremia, arthritis  Interval History: biopsy of rash done today.  No changes, no fever.  Does not answer my questions.     Physical Exam: Constitutional:  Filed Vitals:   02/19/15 0541 02/19/15 1539  BP: 111/65 103/51  Pulse: 118 112  Temp: 97.5 F (36.4 C) 98 F (36.7 C)  Resp: 20 20   patient appears in NAD; does not respond to questions, arousable Eyes: anicteric HENT: no thrush Respiratory: Normal respiratory effort; CTA B Cardiovascular: RRR GI: soft, nt, nd Skin: diffuse pruritic, open lesions with linear marks c/w scratching; dry flakes of skin MS: right knee with minimal warmth, some mild swelling compared to left  Review of Systems: Unable to be assessed due to mental status  Lab Results  Component Value Date   WBC 17.9* 02/19/2015   HGB 9.3* 02/19/2015   HCT 30.1* 02/19/2015   MCV 90.4 02/19/2015   PLT 314 02/19/2015    Lab Results  Component Value Date   CREATININE 1.46* 02/19/2015   BUN 20 02/19/2015   NA 145 02/19/2015   K 4.2 02/19/2015   CL 116* 02/19/2015   CO2 24 02/19/2015    Lab Results  Component Value Date   ALT 22 02/09/2015   AST 40 02/09/2015   ALKPHOS 89 02/09/2015     Microbiology: Recent Results (from the past 240 hour(s))  Stool culture     Status: None   Collection Time: 02/09/15  5:36 PM  Result Value Ref Range Status   Specimen Description STOOL PERIRECTAL  Final   Special Requests Immunocompromised  Final   Culture   Final    ABUNDANT STAPHYLOCOCCUS AUREUS Note: RIFAMPIN AND GENTAMICIN SHOULD NOT BE USED AS SINGLE DRUGS FOR TREATMENT OF STAPH INFECTIONS. NO SALMONELLA, SHIGELLA, CAMPYLOBACTER, YERSINIA, OR E.COLI 0157:H7 ISOLATED Note: REDUCED NORMAL FLORA PRESENT Performed at [facility]    Report Status 02/13/2015 FINAL  Final  Culture, blood (routine x 2)     Status: None   Collection Time: 02/10/15  3:45 PM  Result Value Ref Range Status   Specimen Description BLOOD RIGHT ARM  Final   Special Requests BOTTLES DRAWN AEROBIC AND ANAEROBIC 10 CC EA  Final   Culture   Final    NO GROWTH 5 DAYS Performed at [facility]    Report Status 02/15/2015 FINAL  Final  Culture, blood (routine x 2)     Status: None   Collection Time: 02/10/15  5:24 PM  Result Value Ref Range Status   Specimen Description BLOOD RIGHT ANTECUBITAL  Final   Special Requests BOTTLES DRAWN AEROBIC AND ANAEROBIC 10CC  Final   Culture   Final    NO GROWTH 5 DAYS Performed at [facility]    Report Status 02/15/2015 FINAL  Final  C difficile quick scan w PCR reflex     Status: None   Collection Time: 02/16/15  2:59 AM  Result Value Ref Range Status   C Diff antigen NEGATIVE NEGATIVE Final   C Diff toxin NEGATIVE NEGATIVE Final   C Diff interpretation Negative for toxigenic C. difficile  Final    Impression/Plan:  1. MRSA bacteremia - on daptomycin now with increased creat on vancomycin.  With rash, I agree that prolonged course of 6 weeks IV indicated through January 3rd.  TTE ok.  Likely source is from the skin.  Repeat blood cultures  negative.  Has picc line.   2. 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  Would avoid immunosuppressives at this time. Biopsy done today.   3. Arthritis - high WBC but not over 100,000 on aspiration (61,000) and negative culture.  Unclear etiology but does not seem significantly warm now and likely not improved just from antibiotics so I also suspect not septic arthritis.    4. AKI - likely from vancomycin.  Improved.    Dr. Ninetta LightsHatcher available over the weekend if needed, otherwise I will follow up on Monday.

## 2015-02-19 NOTE — Consult Note (Signed)
Neddie Dinardi May 27, 1950  [id number].   Requesting MD: Dr. Trula Ore Rama Chief Complaint/Reason for Consult: need for punch biopsy HPI: This is a 64 yo black male who was admitted for sepsis, ARF, and skin "rash" with a h/o psoriasis according to the H&P.  The patient states he has had this rash since this summer.  He has seen a dermatologist who, per the patient, performed a biopsy.  He states those results did not show anything.  He has been on steroids intermittently.  This process has progressed since admission based off of the admitting pictures to what this looks like now.  Primary service is trying to get the patient to [facility] where they have dermatologic specialist but currently they do not have any beds.  We have been asked in the interim to see the patient for a skin punch biopsy to try and determine a diagnosis.   ROS : Please see HPI.  He states that these areas burn and hurt.  He admits to cold chills at times.  Family History  Problem Relation Age of Onset  . Hypertension Other     Past Medical History  Diagnosis Date  . Eczema     Past Surgical History  Procedure Laterality Date  . No past surgeries      Social History:  reports that he has been smoking Cigarettes.  He has been smoking about 1.00 pack per day. He does not have any smokeless tobacco history on file. He reports that he drinks alcohol. He reports that he does not use illicit drugs.  Allergies: No Known Allergies  Medications Prior to Admission  Medication Sig Dispense Refill  . hydrOXYzine (ATARAX/VISTARIL) 25 MG tablet Take 1 tablet (25 mg total) by mouth every 8 (eight) hours as needed. (Patient not taking: Reported on 02/08/2015) 12 tablet 0  . predniSONE (DELTASONE) 20 MG tablet Take 3 tablets (60 mg total) by mouth daily. (Patient not taking: Reported on 02/08/2015) 9 tablet 0  . triamcinolone cream (KENALOG) 0.1 % Apply 1 application topically 2 (two) times daily. (Patient not taking: Reported on  02/08/2015) 45 g 0    Blood pressure 111/65, pulse 118, temperature 97.5 F (36.4 C), temperature source Oral, resp. rate 20, height 5\' 7"  (1.702 m), weight 77.9 kg (171 lb 11.8 oz), SpO2 100 %. Physical Exam: General: black male who is laying in bed in NAD Heart: regular, rate, and rhythm.  Normal s1,s2. No obvious murmurs, gallops, or rubs noted.  Palpable radial and pedal pulses bilaterally Lungs: CTAB, no wheezes, rhonchi, or rales noted.  Respiratory effort nonlabored Skin: warm and dry.  He has a diffuse dermatologic condition from his head to his feet.  It is worse on his head, face (nose sparing), neck, arms, legs.  His stomach has some lesions on it, but it does not have the linear like excoriations that his arms and legs have.  His face is not linear but more circular excoriations.  His neck has some excess skin that is hardened.  His entire body has some flaking dry skin as well. Psych: A&Ox3 with an appropriate affect.    Results for orders placed or performed during the hospital encounter of 02/08/15 (from the past 48 hour(s))  Vancomycin, random     Status: None   Collection Time: 02/17/15 10:35 PM  Result Value Ref Range   Vancomycin Rm 32 ug/mL    Comment:        Random Vancomycin therapeutic range is dependent on dosage and  time of specimen collection. A peak range is 20.0-40.0 ug/mL A trough range is 5.0-15.0 ug/mL          Vancomycin, random     Status: None   Collection Time: 02/18/15  5:06 AM  Result Value Ref Range   Vancomycin Rm 27 ug/mL    Comment:        Random Vancomycin therapeutic range is dependent on dosage and time of specimen collection. A peak range is 20.0-40.0 ug/mL A trough range is 5.0-15.0 ug/mL          Basic metabolic panel     Status: Abnormal   Collection Time: 02/18/15  9:00 AM  Result Value Ref Range   Sodium 150 (H) 135 - 145 mmol/L   Potassium 4.5 3.5 - 5.1 mmol/L   Chloride 119 (H) 101 - 111 mmol/L   CO2 25 22 - 32 mmol/L    Glucose, Bld 130 (H) 65 - 99 mg/dL   BUN 22 (H) 6 - 20 mg/dL   Creatinine, Ser 9.62 (H) 0.61 - 1.24 mg/dL   Calcium 7.8 (L) 8.9 - 10.3 mg/dL   GFR calc non Af Amer 45 (L) >60 mL/min   GFR calc Af Amer 52 (L) >60 mL/min    Comment: (NOTE) The eGFR has been calculated using the CKD EPI equation. This calculation has not been validated in all clinical situations. eGFR's persistently <60 mL/min signify possible Chronic Kidney Disease.    Anion gap 6 5 - 15  CK     Status: None   Collection Time: 02/18/15  9:00 AM  Result Value Ref Range   Total CK 131 49 - 397 U/L  Basic metabolic panel     Status: Abnormal   Collection Time: 02/19/15  4:18 AM  Result Value Ref Range   Sodium 145 135 - 145 mmol/L   Potassium 4.2 3.5 - 5.1 mmol/L   Chloride 116 (H) 101 - 111 mmol/L   CO2 24 22 - 32 mmol/L   Glucose, Bld 107 (H) 65 - 99 mg/dL   BUN 20 6 - 20 mg/dL   Creatinine, Ser 9.52 (H) 0.61 - 1.24 mg/dL   Calcium 7.7 (L) 8.9 - 10.3 mg/dL   GFR calc non Af Amer 48 (L) >60 mL/min   GFR calc Af Amer 56 (L) >60 mL/min    Comment: (NOTE) The eGFR has been calculated using the CKD EPI equation. This calculation has not been validated in all clinical situations. eGFR's persistently <60 mL/min signify possible Chronic Kidney Disease.    Anion gap 5 5 - 15  CBC     Status: Abnormal   Collection Time: 02/19/15  4:18 AM  Result Value Ref Range   WBC 17.9 (H) 4.0 - 10.5 K/uL   RBC 3.33 (L) 4.22 - 5.81 MIL/uL   Hemoglobin 9.3 (L) 13.0 - 17.0 g/dL   HCT 84.1 (L) 32.4 - 40.1 %   MCV 90.4 78.0 - 100.0 fL   MCH 27.9 26.0 - 34.0 pg   MCHC 30.9 30.0 - 36.0 g/dL   RDW 02.7 (H) 25.3 - 66.4 %   Platelets 314 150 - 400 K/uL   Dg Chest Port 1 View  02/18/2015  CLINICAL DATA:  64 year old male status post central line placement. EXAM: PORTABLE CHEST 1 VIEW COMPARISON:  Chest x-ray 02/13/2015. FINDINGS: Previously noted right upper extremity PICC has been removed. There is a new right upper extremity PICC  which appears to of been placed in a very high  position in the right upper extremity, with the tip terminating in the distal superior vena cava approximately 1.5 cm above the superior cavoatrial junction. No pneumothorax. Mild diffuse bronchial wall thickening. No acute consolidative airspace disease. No pleural effusions. No evidence of pulmonary edema. Heart size is mildly enlarged. Upper mediastinal contours are distorted by patient's rotation to the right. Atherosclerosis in the thoracic aorta. IMPRESSION: 1. New right upper extremity PICC with tip terminating in the distal superior vena cava. 2. Diffuse peribronchial cuffing suggestive of severe acute bronchitis. 3. Mild cardiomegaly. 4. Atherosclerosis. Electronically Signed   By: Trudie Reed M.D.   On: 02/18/2015 21:09       Assessment/Plan 1. Diffuse skin rash/excoriation -punch biopsy performed and specimen has been left with nursing staff to be taken down to pathology -dry dressing in place, band aid can be placed over small wound tomorrow. -no surgical follow up warranted. -we will sign off.  Call if further assistance needed.  Lawrence Mitch E 02/19/2015, 11:22 AM Pager: [phone]

## 2015-02-20 MED ORDER — SIMETHICONE 80 MG PO CHEW
80.0000 mg | CHEWABLE_TABLET | Freq: Four times a day (QID) | ORAL | Status: DC | PRN
  Filled 2015-02-20: qty 1

## 2015-02-20 MED ORDER — LORATADINE 10 MG PO TABS
10.0000 mg | ORAL_TABLET | Freq: Every day | ORAL | Status: DC
  Administered 2015-02-20 – 2015-02-21 (×2): 10 mg via ORAL
  Filled 2015-02-20 (×2): qty 1

## 2015-02-20 MED ORDER — METHOTREXATE 2.5 MG PO TABS
10.0000 mg | ORAL_TABLET | ORAL | Status: DC
  Administered 2015-02-20: 10 mg via ORAL
  Filled 2015-02-20: qty 4

## 2015-02-20 NOTE — Progress Notes (Signed)
Earlier this shift, noticed PICC dressing not in place.  Notified IV team.  IV team notified this nurse that PICC line was out.  MD notified.  Levora AngelHannah V Juliahna Wiswell, RN

## 2015-02-20 NOTE — Progress Notes (Signed)
Found right Hanna PICC pulled out. Cath intact, sutures x2 still in place,removed sutures&applied vaseline,occlusive dsg over insertion site. Unable to find veins for PIV.Notified primary RN.

## 2015-02-20 NOTE — Progress Notes (Signed)
Progress Note   Charles [name] OEV:[id number] DOB: Dec 14, 1950 DOA: 02/08/2015 PCP: Elyn Peers, MD   Brief Narrative:   Charles [name] is an 64 y.o. male with h/o eczema presented with 24 hours of right knee pain. He was found to be in sepsis from MRSA UTI and right knee infection. He was started on vancomycin and zosyn. Orthopedics & ID subsequently consulted. 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  Culture Data: BCUX x 2 02/08/15: Diptheroids in one bottle, MRSA and group C streptococcus in other bottle. Urine culture 02/08/15: MRSA Right knee synovial fluid culture: Negative BCUX x 2 02/09/15: Negative    Assessment/Plan:   Principal problem:  Sepsis secondary to urinary tract infection with hematuria/MRSA and group C streptococcus bacteremia - Met criteria for sepsis on admission, source: Urinary tract, urine culture + MRSA sensitive to vancomyin.  - Blood culture 2: GPC and rods,One bottle grew MRSA and group c streptococcus.  - Repeat cultures ordered and have been negative so far.  - Echocardiogram showed Good LVEF, no vegetations seen.  - Antibiotics switched to Cubicin from vancomycin, rocephin due to concerns of worsening skin condition per ID recommendations. - He will need 6 weeks of antibiotics for presumed septic arthritis. - Orthopedic surgeon continues to follow with no current recommendations for surgery given high risk of complications.  Active problems:  Hypoglycemia - Noted on admission. Resolved.  Hypernatremia/hyperkalemia - Sodium normalizing with hypotonic IV fluids.  Acute kidney injury versus stage III chronic kidney disease - Likely has stage III chronic kidney disease given relative stability of creatinine in the 1.5-1.7 rangesince admission. - Baseline creatinine not documented. Creatinine trending down after starting IV fluids 02/17/15.  Right knee pain - Patient status post right knee aspiration which  showed 66,000 WBC, synovial fluid cultures negative to date. - Orthopedic surgeon following, no current recommendations for surgical intervention given high risk. - Status post PT/OT consultations, SNF recommended. - Open arthrotomy recommended by ID.  Continue Cubicin per ID recommendations.  ?Psoriasis versus erythroderma  - Patient needs to follow up dermatology as an outpatient. - Pt's primary dermatologist was contacted.  - Per dermatology, hx of noncompliance of treatment x 3 years and was supposed to be on methotrexate with topical triamcinolone.  - Discussed with ID and it is OK to give methotrexate while here. Continue methotrexate 10 mg weekly and BID topical triamcinolone. - Given severity of skin eruption, would transfer to [facility] for urgent dermatology consultation when bed available. - Unfortunately, no beds available at [facility]. - S/P skin biopsy 02/19/15. - ANA pending.  Sinus tachycardia - Likely secondary to sepsis, improved.  - TSH 2.273.  Transient hypotension - Resolved with IVF, thought to be from sepsis.  Normocytic anemia  - Unknown baseline hemoglobin, stable hemoglobin.  - Continue to monitor CBC. - Anemia panel shows iron of 12, ferritin of 80. - Stool for occult blood is negative.   Poor IV Access - PICC line put in, but it came out. - Tunnelled PICC line placed.   Leukocytosis: slowly improving - Secondary to infection/inflammation.    DVT Prophylaxis - SCDs ordered.   Family Communication/Anticipated D/C date and plan/Code Status   Family Communication: No family currently at the bedside. Disposition Plan: Home when stable. Anticipated D/C date:   Critically ill, unable to determine, with ongoing need for IV antibiotics.   Code Status: Full code.  IV Access:    PICC line placed 02/14/15  Procedures and diagnostic studies:  Dg Chest Port 1 View  02/08/2015  CLINICAL DATA:  64 year old male with fever EXAM: PORTABLE CHEST 1 VIEW  COMPARISON:  Right knee radiographs obtained concurrently FINDINGS: Heart is upper limits of normal for size. Mediastinal contours are within normal limits. Atherosclerotic calcifications are present in the transverse aorta. The lungs are clear. No pleural effusion or pneumothorax. No acute osseous abnormality. IMPRESSION: 1. Borderline cardiomegaly. 2. Aortic atherosclerosis. 3. No acute cardiopulmonary process. Electronically Signed   By: Jacqulynn Cadet M.D.   On: 02/08/2015 17:50   Dg Knee Right Port  02/08/2015  CLINICAL DATA:  64 year old male with 1 day history of right knee pain and low-grade fever EXAM: PORTABLE RIGHT KNEE - 1-2 VIEW COMPARISON:  Prior radiographs of the lower leg including the ankle 09/27/2010 FINDINGS: Positive for small ankle joint effusion. No evidence of acute fracture or malalignment. Normal bony mineralization. No lytic or blastic osseous lesion. No periosteal reaction. Mild tricompartmental degenerative change. IMPRESSION: 1. Positive for small suprapatellar knee joint effusion. Septic arthritis should be considered within the differential given the clinical history of fever. Additionally, degenerative and inflammatory effusions are considerations. 2. Mild tricompartmental osteoarthritis. Electronically Signed   By: Jacqulynn Cadet M.D.   On: 02/08/2015 17:52     Medical Consultants:    ID: Truman Hayward, MD  Orthopedic Surgery: Netta Cedars, MD  Anti-Infectives:    Cubicin 02/17/15--->  Vancomycin 02/08/15---> 02/17/15  Rocephin 02/10/15---> 02/17/15  Zosyn 02/08/15---> 02/10/15   Subjective:   Charles [name] continues to complain of itchy/irritated skin.  Appetite good. No nausea or vomiting.  Still having knee pain. Bowels moving.   Objective:    Filed Vitals:   02/19/15 0541 02/19/15 1539 02/19/15 2124 02/20/15 0555  BP: 111/65 103/51 111/50 98/50  Pulse: 118 112 115 112  Temp: 97.5 F (36.4 C) 98 F (36.7 C) 97.8 F (36.6 C)  97.7 F (36.5 C)  TempSrc: Oral Oral Oral Oral  Resp: $Remo'20 20 20 20  'lDWPj$ Height:      Weight:      SpO2: 100% 100% 97% 97%    Intake/Output Summary (Last 24 hours) at 02/20/15 0757 Last data filed at 02/20/15 0449  Gross per 24 hour  Intake 3263.33 ml  Output      0 ml  Net 3263.33 ml   Filed Weights   02/08/15 1619 02/08/15 2252  Weight: 70.308 kg (155 lb) 77.9 kg (171 lb 11.8 oz)    Exam: Gen:  NAD Cardiovascular:  Tachy, No M/R/G Respiratory:  Lungs CTAB Gastrointestinal:  Abdomen soft, NT/ND, + BS Extremities:  No C/E/C Skin: No change  Data Reviewed:    Labs: Basic Metabolic Panel:  Recent Labs Lab 02/15/15 0434 02/16/15 0500 02/17/15 0440 02/18/15 0900 02/19/15 0418  NA 141 144 148* 150* 145  K 4.6 5.0 5.4* 4.5 4.2  CL 113* 113* 119* 119* 116*  CO2 21* $Remov'24 23 25 24  'rZmWHo$ GLUCOSE 87 95 107* 130* 107*  BUN 23* 24* 24* 22* 20  CREATININE 1.48* 1.62* 1.62* 1.55* 1.46*  CALCIUM 7.7* 7.7* 7.8* 7.8* 7.7*   GFR Estimated Creatinine Clearance: 46.5 mL/min (by C-G formula based on Cr of 1.46).  CBC:  Recent Labs Lab 02/14/15 0624 02/16/15 0500 02/17/15 0440 02/19/15 0418  WBC 19.6* 25.1* 21.5* 17.9*  HGB 10.2* 9.2* 8.9* 9.3*  HCT 33.3* 29.8* 28.6* 30.1*  MCV 89.0 87.4 87.2 90.4  PLT 367 343 323 314   CBG: No results for input(s): GLUCAP in the last  168 hours. Sepsis Labs:  Recent Labs Lab 02/14/15 0624 02/16/15 0500 02/17/15 0440 02/19/15 0418  WBC 19.6* 25.1* 21.5* 17.9*   Microbiology Recent Results (from the past 240 hour(s))  Culture, blood (routine x 2)     Status: None   Collection Time: 02/10/15  3:45 PM  Result Value Ref Range Status   Specimen Description BLOOD RIGHT ARM  Final   Special Requests BOTTLES DRAWN AEROBIC AND ANAEROBIC 10 CC EA  Final   Culture   Final    NO GROWTH 5 DAYS Performed at [facility]    Report Status 02/15/2015 FINAL  Final  Culture, blood (routine x 2)     Status: None   Collection Time: 02/10/15   5:24 PM  Result Value Ref Range Status   Specimen Description BLOOD RIGHT ANTECUBITAL  Final   Special Requests BOTTLES DRAWN AEROBIC AND ANAEROBIC 10CC  Final   Culture   Final    NO GROWTH 5 DAYS Performed at [facility]    Report Status 02/15/2015 FINAL  Final  C difficile quick scan w PCR reflex     Status: None   Collection Time: 02/16/15  2:59 AM  Result Value Ref Range Status   C Diff antigen NEGATIVE NEGATIVE Final   C Diff toxin NEGATIVE NEGATIVE Final   C Diff interpretation Negative for toxigenic C. difficile  Final     Medications:   . DAPTOmycin (CUBICIN)  IV  500 mg Intravenous Q24H  . feeding supplement (ENSURE ENLIVE)  237 mL Oral BID BM  . folic acid  1 mg Oral Daily  . hydrocerin   Topical BID  . hydrOXYzine  25 mg Oral TID  . sodium chloride  3 mL Intravenous Q12H  . triamcinolone ointment   Topical BID   Continuous Infusions: . sodium chloride 125 mL/hr at 02/20/15 0449    Time spent: 35 minutes.  The patient is medically complex with multiple co-morbidities and is at high risk for clinical deterioration and requires high complexity decision making.    LOS: 12 days   [facility] Pager [phone]. If unable to reach me by pager, please call my cell phone at [phone].  *Please refer to [email or web address], password [email or web address] to get updated schedule on who will round on this patient, as hospitalists switch teams weekly. If 7PM-7AM, please contact night-coverage at [email or web address], password [email or web address] for any overnight needs.  02/20/2015, 7:57 AM

## 2015-02-21 DIAGNOSIS — Z113 Encounter for screening for infections with a predominantly sexual mode of transmission: Secondary | ICD-10-CM

## 2015-02-21 DIAGNOSIS — A419 Sepsis, unspecified organism: Secondary | ICD-10-CM

## 2015-02-21 DIAGNOSIS — R6520 Severe sepsis without septic shock: Secondary | ICD-10-CM

## 2015-02-21 MED ORDER — FOLIC ACID 1 MG PO TABS: 1.0000 mg | ORAL_TABLET | Freq: Every day | ORAL | Status: AC

## 2015-02-21 MED ORDER — HYDROCERIN EX CREA: 1.0000 "application " | TOPICAL_CREAM | Freq: Two times a day (BID) | CUTANEOUS | Status: AC

## 2015-02-21 MED ORDER — LORATADINE 10 MG PO TABS: 10.0000 mg | ORAL_TABLET | Freq: Every day | ORAL | Status: AC

## 2015-02-21 MED ORDER — HYDROCODONE-ACETAMINOPHEN 5-325 MG PO TABS: 1.0000 | ORAL_TABLET | ORAL | Status: AC | PRN

## 2015-02-21 MED ORDER — HYDROXYZINE HCL 25 MG PO TABS: 25.0000 mg | ORAL_TABLET | Freq: Three times a day (TID) | ORAL | Status: AC

## 2015-02-21 MED ORDER — DIPHENHYDRAMINE HCL 25 MG PO CAPS: 25.0000 mg | ORAL_CAPSULE | ORAL | Status: AC | PRN

## 2015-02-21 MED ORDER — SODIUM CHLORIDE 0.9 % IV SOLN: 500.0000 mg | INTRAVENOUS | Status: AC

## 2015-02-21 MED ORDER — ENSURE ENLIVE PO LIQD: 237.0000 mL | Freq: Two times a day (BID) | ORAL | Status: AC

## 2015-02-21 MED ORDER — LORAZEPAM 2 MG/ML IJ SOLN: 1.0000 mg | INTRAMUSCULAR | Status: AC | PRN

## 2015-02-21 NOTE — Progress Notes (Signed)
Assessed at bedside for vascular access.  Patient has staph aureus / streptococcal bacteremia with extensive peeling, erythematous pruritic rash s/p biopsy.  PICC line insertion not available at this time.  Assessed for central line, discussed risks / benefits with patient.  He would prefer PICC line placement over CVC.  Agree, that he likely will need PICC for duration of therapies and to avoid two procedures PIV was attempted.  #20g PIV placed to large left AC vessel x1 stick.  Area securely dressed.   Dr. Darnelle Catalanama updated on status.     Canary BrimBrandi Nijae Doyel, NP-C [facility] Pgr: [phone] or if no answer [phone] 02/21/2015, 4:14 PM

## 2015-02-21 NOTE — Discharge Summary (Signed)
Physician Discharge Summary  Charles [name] ION:[id number] DOB: May 18, 1950 DOA: 02/08/2015  PCP: Geraldo Pitter, MD  Admit date: 02/08/2015 Discharge date: 02/21/2015   Recommendations for Outpatient Follow-Up:   1. Needs close F/U with dermatologist.  The patient is being transferred to [facility] for dermatological consultation.   Discharge Diagnosis:   Principal Problem:    Staphylococcus aureus bacteremia with sepsis (HCC) Active Problems:    Acute renal failure (ARF) (HCC)    Anemia    Cellulitis    Sepsis (HCC)    UTI (lower urinary tract infection)    Severe sepsis (HCC)    Staphylococcal arthritis of right knee (HCC)    Erythroderma    Screen for STD (sexually transmitted disease)    Poor intravenous access    Streptococcal infection group G    Cellulitis of knee   Discharge disposition:  [facility]  Discharge Condition: Stable for transfer.  Diet recommendation: Low sodium, heart healthy.    History of Present Illness:   Charles [name] is an 64 y.o. male with h/o eczema (diffuse "rash") who  presented 02/08/15 with 24 hours of right knee pain. He was thought to have sepsis/septic arthritis on admission (presented with fever 101.1 white blood cell count 21.5 hemoglobin 10.7 creatinine up to 1.55. Elevated lactic acid of 5).  Initial blood and urine cultures consistent with sepsis from MRSA UTI and probable right knee infection (synovial cultures negative). He was started on vancomycin and zosyn. Orthopedics & ID subsequently consulted. 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  The patient is being transferred to [facility] for further evaluation and dermatological consultation.   Hospital Course by Major Events:   02/08/15: Admitted with sepsis, suspected septic arthritis. Evaluated by orthopedics with bedside aspiration and plan for I and D. Started on Vanc/Zosyn. No bacteria seen on Gram stain of aspirated synovial  fluid. 02/09/15: Evaluated by wound care nurse. Skin eruption felt to be out of the scope of practice for wound care nurse. 2-D echo performed with no evidence of endocarditis. Repeat blood cultures were negative. 02/10/15:1 blood culture positive for MRSA & group C strep (other culture positive for diphtheroids), urine culture positive for MRSA, ID consulted. Rocephin added. Zosyn discontinued. 02/11/15: ID recommends open knee arthrotomy however patient refused surgery. 02/12/15: ID recommends 6 weeks of therapy for MRSA bacteremia/disseminated infection. PICC line placed. 02/13/15: PICC line pulled out. Patient scratching at his skin with multiple excoriations/bleeding. Benadryl and Atarax ordered. 02/14/15: Tunneled PICC line placed. Rocephin discontinued. 02/15/15: Primary dermatologist contacted by telephone. History of noncompliance. Treatment with methotrexate and topical triamcinolone had been recommended. ID recommends transfer to [facility] for dermatology consultation. 02/17/15: Nursing staff feels skin condition is deteriorating from admission appearance. Vancomycin stopped and Cubicin started. [facility] contacted for consideration of transfer however under code orange and no beds available. 02/18/15: ID recommends avoidance of immunosuppressant therapy. 02/19/15: Punch biopsy performed. Results pending. 02/21/15: Pulled PICC line out again. Interventional radiology unable to replace today. Accepted for transfer by Dr. Samuella Cota at [facility].     Hospital Course by Problem:   Principal problem:  Sepsis secondary to urinary tract infection with hematuria/MRSA and group C streptococcus bacteremia - Met criteria for sepsis on admission, source: MRSA in blood and urine as well as group C streptococcus and blood.   - Repeat cultures done 02/09/15 and were negative.  - Echocardiogram done 02/09/15 showed Good LVEF, no vegetations seen.  - Antibiotics with vancomycin and continued through  02/17/15, now on  Cubicin. - He will need 6 weeks of antibiotics for presumed septic arthritis/MRSA bacteremia. - Orthopedic surgeon does not recommend further orthopedic surgery given high risk of complications and improvement in the effusion and pain.  Active problems:  Hypoglycemia - Noted on admission. Resolved.  Hypernatremia/hyperkalemia - Sodium normalized with hypotonic IV fluids.  Acute kidney injury versus stage III chronic kidney disease - Likely has stage III chronic kidney disease given relative stability of creatinine in the 1.5-1.7 rangesince admission. - Baseline creatinine not documented. Creatinine trending down after starting IV fluids 02/17/15.  Right knee pain secondary to presumed septic arthritis - Patient status post right knee aspiration which showed 66,000 WBC, synovial fluid cultures negative to date. - Orthopedic surgeon following, no current recommendations for surgical intervention given high risk. - Status post PT/OT consultations, SNF recommended. - Open arthrotomy recommended by ID. Continue Cubicin per ID recommendations.  ?Psoriasis versus erythroderma  - Pt's primary dermatologist (Dr. Jorja [name]) was contacted.  - Per dermatology, hx of noncompliance of treatment x 3 years and was supposed to be on methotrexate with topical triamcinolone.  - Given severity of skin eruption, would transfer to [facility] for urgent dermatology consultation when bed available. - S/P skin biopsy 02/19/15. - ANA pending.  Sinus tachycardia - Likely secondary to sepsis, improved.  - TSH 2.273.  Transient hypotension - Resolved with IVF, thought to be from sepsis.  Normocytic anemia  - Unknown baseline hemoglobin, stable hemoglobin.  - Continue to monitor CBC. - Anemia panel shows iron of 12, ferritin of 80. - Stool for occult blood is negative.   Poor IV Access - PICC line put in, but it came out. - Tunnelled PICC line placed and he pulled this out 02/20/15.  Attempting to reestablish IV access.  Leukocytosis: slowly improving - Secondary to infection/inflammation.    Medical Consultants:    ID: Randall Hiss, MD  Orthopedic Surgery: Beverely Low, MD   Discharge Exam:   Filed Vitals:   02/21/15 0212 02/21/15 0551  BP: 99/45 108/46  Pulse: 118 120  Temp: 98.3 F (36.8 C) 97.8 F (36.6 C)  Resp: 18 18   Filed Vitals:   02/20/15 1600 02/20/15 2109 02/21/15 0212 02/21/15 0551  BP: 102/58 110/46 99/45 108/46  Pulse: 110 117 118 120  Temp: 98.6 F (37 C) 97.8 F (36.6 C) 98.3 F (36.8 C) 97.8 F (36.6 C)  TempSrc: Oral Oral Oral Oral  Resp: 18 19 18 18   Height:      Weight:      SpO2: 98% 99% 98% 98%    Gen:  NAD Cardiovascular:  RRR, No M/R/G Respiratory: Lungs CTAB Gastrointestinal: Abdomen soft, NT/ND with normal active bowel sounds. Extremities: No C/E/C Skin: See images below which show evolution of skin eruption over time:                                 The results of significant diagnostics from this hospitalization (including imaging, microbiology, ancillary and laboratory) are listed below for reference.     Procedures and Diagnostic Studies:   Dg Chest Port 1 View  02/28/15  CLINICAL DATA:  64 year old male with fever EXAM: PORTABLE CHEST 1 VIEW COMPARISON:  Right knee radiographs obtained concurrently FINDINGS: Heart is upper limits of normal for size. Mediastinal contours are within normal limits. Atherosclerotic calcifications are present in the transverse aorta. The lungs are clear. No pleural effusion or pneumothorax. No acute  osseous abnormality. IMPRESSION: 1. Borderline cardiomegaly. 2. Aortic atherosclerosis. 3. No acute cardiopulmonary process. Electronically Signed   By: Malachy Moan M.D.   On: 02/08/2015 17:50   Dg Knee Right Port  02/08/2015  CLINICAL DATA:  64 year old male with 1 day history of right knee pain and low-grade fever EXAM: PORTABLE  RIGHT KNEE - 1-2 VIEW COMPARISON:  Prior radiographs of the lower leg including the ankle 09/27/2010 FINDINGS: Positive for small ankle joint effusion. No evidence of acute fracture or malalignment. Normal bony mineralization. No lytic or blastic osseous lesion. No periosteal reaction. Mild tricompartmental degenerative change. IMPRESSION: 1. Positive for small suprapatellar knee joint effusion. Septic arthritis should be considered within the differential given the clinical history of fever. Additionally, degenerative and inflammatory effusions are considerations. 2. Mild tricompartmental osteoarthritis. Electronically Signed   By: Malachy Moan M.D.   On: 02/08/2015 17:52     Labs:   Basic Metabolic Panel:  Recent Labs Lab 02/15/15 0434 02/16/15 0500 02/17/15 0440 02/18/15 0900 02/19/15 0418  NA 141 144 148* 150* 145  K 4.6 5.0 5.4* 4.5 4.2  CL 113* 113* 119* 119* 116*  CO2 21* 24 23 25 24   GLUCOSE 87 95 107* 130* 107*  BUN 23* 24* 24* 22* 20  CREATININE 1.48* 1.62* 1.62* 1.55* 1.46*  CALCIUM 7.7* 7.7* 7.8* 7.8* 7.7*   GFR Estimated Creatinine Clearance: 46.5 mL/min (by C-G formula based on Cr of 1.46). Liver Function Tests: No results for input(s): AST, ALT, ALKPHOS, BILITOT, PROT, ALBUMIN in the last 168 hours. No results for input(s): LIPASE, AMYLASE in the last 168 hours. No results for input(s): AMMONIA in the last 168 hours. Coagulation profile No results for input(s): INR, PROTIME in the last 168 hours.  CBC:  Recent Labs Lab 02/16/15 0500 02/17/15 0440 02/19/15 0418  WBC 25.1* 21.5* 17.9*  HGB 9.2* 8.9* 9.3*  HCT 29.8* 28.6* 30.1*  MCV 87.4 87.2 90.4  PLT 343 323 314   Cardiac Enzymes:  Recent Labs Lab 02/18/15 0900  CKTOTAL 131   BNP: Invalid input(s): POCBNP CBG: No results for input(s): GLUCAP in the last 168 hours. D-Dimer No results for input(s): DDIMER in the last 72 hours. Hgb A1c No results for input(s): HGBA1C in the last 72  hours. Lipid Profile No results for input(s): CHOL, HDL, LDLCALC, TRIG, CHOLHDL, LDLDIRECT in the last 72 hours. Thyroid function studies No results for input(s): TSH, T4TOTAL, T3FREE, THYROIDAB in the last 72 hours.  Invalid input(s): FREET3 Anemia work up No results for input(s): VITAMINB12, FOLATE, FERRITIN, TIBC, IRON, RETICCTPCT in the last 72 hours. Microbiology Recent Results (from the past 240 hour(s))  C difficile quick scan w PCR reflex     Status: None   Collection Time: 02/16/15  2:59 AM  Result Value Ref Range Status   C Diff antigen NEGATIVE NEGATIVE Final   C Diff toxin NEGATIVE NEGATIVE Final   C Diff interpretation Negative for toxigenic C. difficile  Final     Discharge Instructions:       Discharge Instructions    Call MD for:  extreme fatigue    Complete by:  As directed      Call MD for:  severe uncontrolled pain    Complete by:  As directed      Call MD for:  temperature >100.4    Complete by:  As directed      Diet general    Complete by:  As directed      Increase  activity slowly    Complete by:  As directed             Medication List    STOP taking these medications        predniSONE 20 MG tablet  Commonly known as:  DELTASONE      TAKE these medications        DAPTOmycin 500 mg in sodium chloride 0.9 % 100 mL  Inject 500 mg into the vein daily.     diphenhydrAMINE 25 mg capsule  Commonly known as:  BENADRYL  Take 1-2 capsules (25-50 mg total) by mouth every 4 (four) hours as needed for itching.     feeding supplement (ENSURE ENLIVE) Liqd  Take 237 mLs by mouth 2 (two) times daily between meals.     folic acid 1 MG tablet  Commonly known as:  FOLVITE  Take 1 tablet (1 mg total) by mouth daily.     hydrocerin Crea  Apply 1 application topically 2 (two) times daily.     HYDROcodone-acetaminophen 5-325 MG tablet  Commonly known as:  NORCO/VICODIN  Take 1 tablet by mouth every 4 (four) hours as needed for moderate pain.      hydrOXYzine 25 MG tablet  Commonly known as:  ATARAX/VISTARIL  Take 1 tablet (25 mg total) by mouth 3 (three) times daily.     loratadine 10 MG tablet  Commonly known as:  CLARITIN  Take 1 tablet (10 mg total) by mouth daily.     LORazepam 2 MG/ML injection  Commonly known as:  ATIVAN  Inject 0.5 mLs (1 mg total) into the vein every 4 (four) hours as needed for anxiety.     triamcinolone cream 0.1 %  Commonly known as:  KENALOG  Apply 1 application topically 2 (two) times daily.       Follow-up Information    Follow up with Geraldo Pitter, MD. Go on 03/03/2015.   Specialty:  Family Medicine   Why:  You have an appointment as a new medicare patient with Dr Renaye Rakers scheduled for 1130 BUT THEY REQUEST YOU ARRIVCE AT 10 AM to get all paper work signed [employer] card, ID card and $20 co pay PLEASE IF you can not attend call [phone] SOON    Contact information:   [address] [place] [address] [phone]        Time coordinating discharge: 1 hour.  Signed:  Dewitt Judice  Pager [phone] [facility] 02/21/2015, 12:37 PM

## 2015-02-21 NOTE — Progress Notes (Signed)
Called report to Daphine Deutscherosemary Chase, RN [facility].  [facility] called.  Paitent going to room 864 in [facility].

## 2015-02-21 NOTE — Progress Notes (Signed)
Dr Rama paged.  Discuss difficulties with bedside PICC placed on patient and having them stay in due to skin/ infection issues.  Dr. Darnelle Catalanama states will consult for possible sutured CVC.  Will follow up later.

## 2015-02-21 NOTE — Progress Notes (Signed)
Patient continues to refuse dressings to open wounds.  Levora AngelHannah V Cire Deyarmin, RN

## 2015-02-21 NOTE — Progress Notes (Signed)
Pharmacy Antibiotic Time-Out Note  Charles [name] is a 64 y.o. year-old male admitted on 02/08/2015.  The patient is currently on Daptomycin for presumed septic arthritis, MRSA and group C streptococcus bacteremia, MRSA UTI .  Assessment/Plan: -  Per ID, planned duration of therapy of antibiotics is 6 weeks (thru 03/23/15). - Unfortunately, PICC came out on 12/03.  Patient did not receive antibiotic dose yesterday.  Plan is for IR to place PICC today. - Once IV access has been established, will resume daptomycin at 500 mg q24h. - continue to monitor CK weekly while on daptomycin (next CK due on 12/8)  Temp (24hrs), Avg:98.1 F (36.7 C), Min:97.8 F (36.6 C), Max:98.6 F (37 C)   Recent Labs Lab 02/16/15 0500 02/17/15 0440 02/19/15 0418  WBC 25.1* 21.5* 17.9*     Recent Labs Lab 02/15/15 0434 02/16/15 0500 02/17/15 0440 02/18/15 0900 02/19/15 0418  CREATININE 1.48* 1.62* 1.62* 1.55* 1.46*   Estimated Creatinine Clearance: 46.5 mL/min (by C-G formula based on Cr of 1.46).   Antimicrobial allergies: none  Antimicrobials this admission: 11/21 >> Zosyn >> 11/23  11/21 >> Vanc >> 11/30 11/23 >> Rocephin >> 11/27 12/1 >> Cubicin >> thru 03/23/15        - 12/1 CK = 131  Microbiology Results: 11/21 blood: 1 of 2 Group C strep, MRSA 11/21 blood: 1 of 2 Diphtheroids FINAL 11/21 urine: >100k MRSA FINAL 11/21 MRSA PCR: positive 11/21 R knee: abundant WBC, no organisms seen 11/22 blood: ngtf 11/22 stool: abundant staph aureus 11/23 bcx x2 (repeat): NGF 11/29: c diff negative   Thank you for allowing pharmacy to be a part of this patient's care.  Lucia Gaskinsham, Deadrian Toya P PharmD 02/21/2015 8:28 AM

## 2015-02-22 LAB — ANTINUCLEAR ANTIBODIES, IFA: ANTINUCLEAR ANTIBODIES, IFA: NEGATIVE

## 2015-04-21 DEATH — deceased

## 2016-02-22 IMAGING — DX DG CHEST 1V PORT
1 series · 1 of 1 positions shown · non-contrast
Comparison: Chest x-ray 02/13/2015.

CLINICAL DATA: 64-year-old male status post central line placement.

EXAM:
PORTABLE CHEST 1 VIEW

[chest ap]
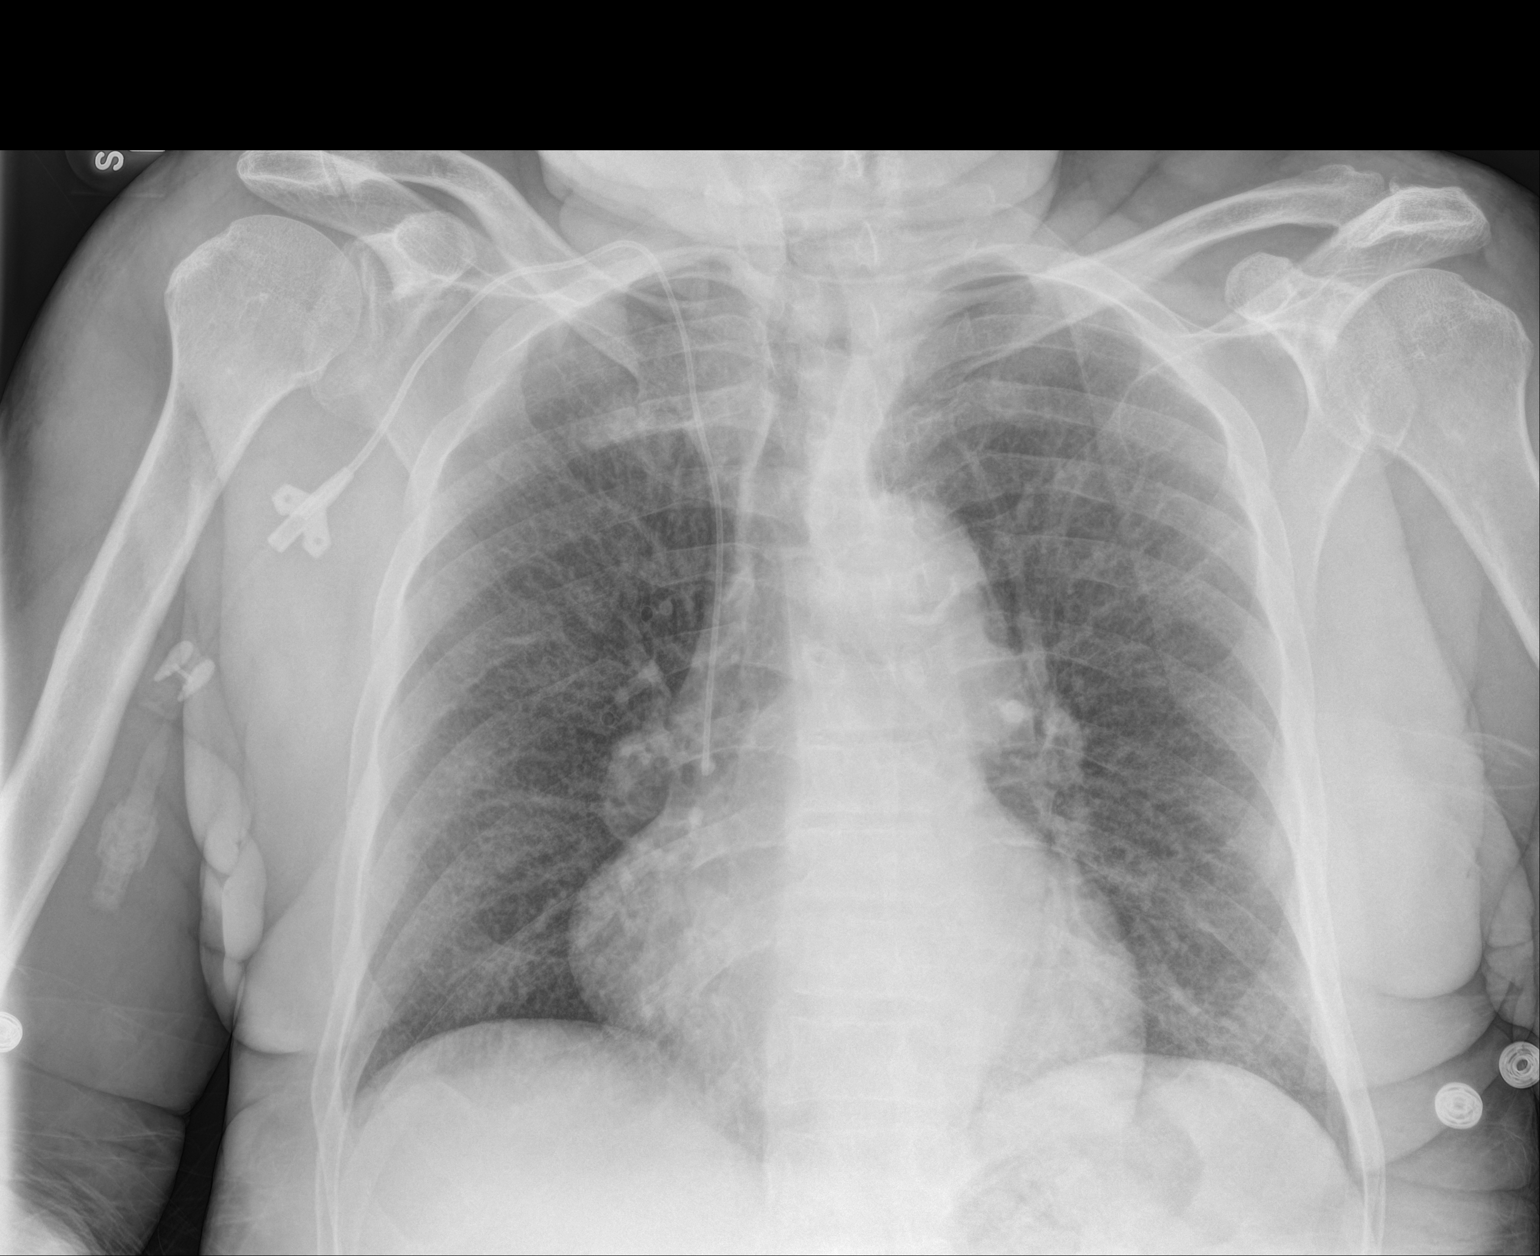

[1 of 1 positions shown; findings below may reference images not displayed]

FINDINGS: Previously noted right upper extremity PICC has been removed. There
is a new right upper extremity PICC which appears to of been placed
in a very high position in the right upper extremity, with the tip
terminating in the distal superior vena cava approximately 1.5 cm
above the superior cavoatrial junction. No pneumothorax. Mild
diffuse bronchial wall thickening. No acute consolidative airspace
disease. No pleural effusions. No evidence of pulmonary edema. Heart
size is mildly enlarged. Upper mediastinal contours are distorted by
patient's rotation to the right. Atherosclerosis in the thoracic
aorta.
IMPRESSION: 1. New right upper extremity PICC with tip terminating in the distal
superior vena cava.
2. Diffuse peribronchial cuffing suggestive of severe acute
bronchitis.
3. Mild cardiomegaly.
4. Atherosclerosis.
